# Patient Record
Sex: Male | Born: 1970 | Race: White | Hispanic: No | Marital: Married | Smoking: Former smoker
Health system: Southern US, Community
[De-identification: ages and names within clinical notes are randomized; demographics above are authoritative.]

## PROBLEM LIST (undated history)

## (undated) DIAGNOSIS — E785 Hyperlipidemia, unspecified: Secondary | ICD-10-CM

## (undated) DIAGNOSIS — K219 Gastro-esophageal reflux disease without esophagitis: Secondary | ICD-10-CM

## (undated) DIAGNOSIS — B019 Varicella without complication: Secondary | ICD-10-CM

## (undated) DIAGNOSIS — M1990 Unspecified osteoarthritis, unspecified site: Secondary | ICD-10-CM

## (undated) HISTORY — DX: Varicella without complication: B01.9

## (undated) HISTORY — DX: Gastro-esophageal reflux disease without esophagitis: K21.9

## (undated) HISTORY — DX: Unspecified osteoarthritis, unspecified site: M19.90

## (undated) HISTORY — DX: Hyperlipidemia, unspecified: E78.5

---
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
## 2004-06-13 ENCOUNTER — Ambulatory Visit: Payer: Self-pay | Admitting: Pulmonary Disease

## 2009-05-14 ENCOUNTER — Emergency Department (HOSPITAL_COMMUNITY): Admission: EM | Admit: 2009-05-14 | Discharge: 2009-05-14 | Payer: Self-pay | Admitting: Emergency Medicine

## 2009-05-27 ENCOUNTER — Emergency Department (HOSPITAL_COMMUNITY): Admission: EM | Admit: 2009-05-27 | Discharge: 2009-05-27 | Payer: Self-pay | Admitting: Emergency Medicine

## 2010-07-21 LAB — POCT I-STAT, CHEM 8
Calcium, Ion: 1.26 mmol/L (ref 1.12–1.32)
Chloride: 107 mEq/L (ref 96–112)
Glucose, Bld: 92 mg/dL (ref 70–99)
HCT: 42 % (ref 39.0–52.0)
Hemoglobin: 14.3 g/dL (ref 13.0–17.0)
Potassium: 4 mEq/L (ref 3.5–5.1)
Sodium: 139 mEq/L (ref 135–145)
TCO2: 28 mmol/L (ref 0–100)

## 2010-07-21 LAB — CBC
Hemoglobin: 14.1 g/dL (ref 13.0–17.0)
Platelets: 297 10*3/uL (ref 150–400)
RDW: 13.5 % (ref 11.5–15.5)

## 2010-07-21 LAB — DIFFERENTIAL
Basophils Absolute: 0.1 10*3/uL (ref 0.0–0.1)
Basophils Relative: 1 % (ref 0–1)
Lymphocytes Relative: 21 % (ref 12–46)
Monocytes Relative: 6 % (ref 3–12)
Neutro Abs: 5.7 10*3/uL (ref 1.7–7.7)

## 2010-07-21 LAB — POCT CARDIAC MARKERS: Myoglobin, poc: 73.4 ng/mL (ref 12–200)

## 2012-03-30 ENCOUNTER — Ambulatory Visit: Payer: Self-pay | Admitting: Internal Medicine

## 2012-03-30 DIAGNOSIS — Z0289 Encounter for other administrative examinations: Secondary | ICD-10-CM

## 2012-07-29 ENCOUNTER — Encounter: Payer: Self-pay | Admitting: Internal Medicine

## 2012-07-29 ENCOUNTER — Ambulatory Visit (INDEPENDENT_AMBULATORY_CARE_PROVIDER_SITE_OTHER)
Admission: RE | Admit: 2012-07-29 | Discharge: 2012-07-29 | Disposition: A | Discharge status: Home / Self Care | Payer: Self-pay | Source: Ambulatory Visit | Attending: Internal Medicine | Admitting: Internal Medicine

## 2012-07-29 ENCOUNTER — Ambulatory Visit (INDEPENDENT_AMBULATORY_CARE_PROVIDER_SITE_OTHER): Payer: Self-pay | Admitting: Internal Medicine

## 2012-07-29 VITALS — BP 110/80 | HR 69 | Temp 98.2°F | Ht 67.25 in | Wt 178.0 lb

## 2012-07-29 DIAGNOSIS — F17200 Nicotine dependence, unspecified, uncomplicated: Secondary | ICD-10-CM

## 2012-07-29 DIAGNOSIS — Z720 Tobacco use: Secondary | ICD-10-CM

## 2012-07-29 DIAGNOSIS — R05 Cough: Secondary | ICD-10-CM | POA: Insufficient documentation

## 2012-07-29 DIAGNOSIS — J302 Other seasonal allergic rhinitis: Secondary | ICD-10-CM | POA: Insufficient documentation

## 2012-07-29 DIAGNOSIS — J309 Allergic rhinitis, unspecified: Secondary | ICD-10-CM

## 2012-07-29 MED ORDER — HYDROCODONE-HOMATROPINE 5-1.5 MG/5ML PO SYRP: 5.0000 mL | ORAL_SOLUTION | ORAL | Status: DC | PRN

## 2012-07-29 MED ORDER — PREDNISONE 20 MG PO TABS: ORAL_TABLET | ORAL | Status: DC

## 2012-07-29 NOTE — Patient Instructions (Addendum)
This could be a reaction tot he inhalation Get a chest x ray.    If ok.    plan steroid tx  And stop smoking. As we discussed .  Also consider taking allergy medicine.   Such as  Allegra claritin and  Zyrtec.  Will look into the  Poss of arsenic exposure testing  And let you know about this.      Cough, Adult  A cough is a reflex that helps clear your throat and airways. It can help heal the body or may be a reaction to an irritated airway. A cough may only last 2 or 3 weeks (acute) or may last more than 8 weeks (chronic).  CAUSES Acute cough:  Viral or bacterial infections. Chronic cough:  Infections.  Allergies.  Asthma.  Post-nasal drip.  Smoking.  Heartburn or acid reflux.  Some medicines.  Chronic lung problems (COPD).  Cancer. SYMPTOMS   Cough.  Fever.  Chest pain.  Increased breathing rate.  High-pitched whistling sound when breathing (wheezing).  Colored mucus that you cough up (sputum). TREATMENT   A bacterial cough may be treated with antibiotic medicine.  A viral cough must run its course and will not respond to antibiotics.  Your caregiver may recommend other treatments if you have a chronic cough. HOME CARE INSTRUCTIONS   Only take over-the-counter or prescription medicines for pain, discomfort, or fever as directed by your caregiver. Use cough suppressants only as directed by your caregiver.  Use a cold steam vaporizer or humidifier in your bedroom or home to help loosen secretions.  Sleep in a semi-upright position if your cough is worse at night.  Rest as needed.  Stop smoking if you smoke. SEEK IMMEDIATE MEDICAL CARE IF:   You have pus in your sputum.  Your cough starts to worsen.  You cannot control your cough with suppressants and are losing sleep.  You begin coughing up blood.  You have difficulty breathing.  You develop pain which is getting worse or is uncontrolled with medicine.  You have a fever. MAKE SURE YOU:    Understand these instructions.  Will watch your condition.  Will get help right away if you are not doing well or get worse. Document Released: 10/17/2010 Document Revised: 07/13/2011 Document Reviewed: 10/17/2010 [facility] Patient Information 2013 [place], [place].

## 2012-07-29 NOTE — Progress Notes (Signed)
Chief Complaint  Patient presents with  . Establish Care    Seen Dr. Tawanna Cooler 10 years ago and has had no other PCP since then.  Complains of an ongoing cough lasting for several weeks.    HPI: Patient comes in as new patient visit . Previous care was  As above .  He has generally been well up until this problem.  orig from  [place].   In [place] but been in [place] since 1986. His mom is a patient in our practice. He's had a problem with coughing and some chest congestion for about a month almost immediately after he was cleaning out chicken coop area and there was some dust. He had upper nasal congestion and coughing but no shortness of breath fever hemoptysis. No fever. Body aches Some of the symptoms and cough was  Worse last week.    Tobacco  Quit for 2 years after  20 year   And then started back 4 months ago.  Stress at work  Wife still smokes .   Smoking 1/2ppd. .   Planning to stop. Has been trying a number of over-the-counter medications ;mucinex  vicks night time   History of allergies  In spring  .   Itchy. eyes   No hx of asthma. Aches over-the-counter Pets dog  And chickens.  After  out coop .   Works as a Financial trader   3 months ago there was some contaminated water they had to clean up unbeknownst to them via right RF might oh Shop vac .   the   after busted water line   there was some concern that there was arsenic iinthe  Water.    Supervisor said that by her mitral wipes and samples were done but no results were given Took asian iodine .  On his own in case there was arsenic exposure since that time he denies any neurologic symptoms significant nausea headaches just the respiratory problems.  ROS: See pertinent positives and negatives per HPI. Negative for chest pain shortness of breath vision hearing changes change in bowel habits major orthopedic problems or neurologic problems. Rest of ROS negative or noncontributory he has had atypical moles removed him from  his back seen a dermatologist in the last year but wants to change for .  Moms in 4  Healthy .  2 sibs younger brothers ok.   Past Medical History  Diagnosis Date  . GERD (gastroesophageal reflux disease)   . Chicken pox   . Hyperlipidemia     Family History  Problem Relation Age of Onset  . Arthritis Mother   . Hyperlipidemia Mother   . Hyperlipidemia Father   . Cancer Maternal Grandmother     Colon Cancer    History   Social History  . Marital Status: Single    Spouse Name: N/A    Number of Children: N/A  . Years of Education: N/A   Social History Main Topics  . Smoking status: Current Every Day Smoker  . Smokeless tobacco: None  . Alcohol Use: Yes  . Drug Use: None  . Sexually Active: None   Other Topics Concern  . None   Social History Narrative   Sleeps 7 hours per night   Married employed Haematologist degree [employer]      Originally from [place] in [place] since 175   83-year-old child at home wife smokes      etoh weekend    Tobacco  1/2pp d for months again from stress at work   Prev 20 pack year   Smoke alarm neg FA    Last td 2013           Outpatient Encounter Prescriptions as of 07/29/2012  Medication Sig Dispense Refill  . HYDROcodone-homatropine (HYCODAN) 5-1.5 MG/5ML syrup Take 5 mLs by mouth every 4 (four) hours as needed for cough.  180 mL  0  . predniSONE (DELTASONE) 20 MG tablet Take 3 po qd for 2 days then 2 po qd for 3 days,or as directed  12 tablet  0   No facility-administered encounter medications on file as of 07/29/2012.    EXAM:  BP 110/80  Pulse 69  Temp(Src) 98.2 F (36.8 C) (Oral)  Ht 5' 7.25" (1.708 m)  Wt 178 lb (80.74 kg)  BMI 27.68 kg/m2  SpO2 97%  Body mass index is 27.68 kg/(m^2).  GENERAL: vitals reviewed and listed above, alert, oriented, appears well hydrated and in no acute distress HEENT: atraumatic, conjunctiva  clear, no obvious abnormalities on inspection of external nose  and ears TMs intact nares slight congestion face nontender OP : no lesion edema or exudate   NECK: no obvious masses on inspection palpation no adenopathy or JVD LUNGS: clear to auscultation bilaterally, no wheezes, rales or rhonchi, good air movement  CV: HRRR, no gallops or murmurs no clubbing cyanosis or  peripheral edema nl cap refill  Abdomen soft without organomegaly guarding or rebound MS: moves all extremities without noticeable focal  abnormality Skin no acute changes there are multiple moles on the back some removed residual pigmentation PSYCH: pleasant and cooperative, no obvious depression or anxiety Neurologic is grossly normal normal gait is healthy otherwise ASSESSMENT AND PLAN:  Discussed the following assessment and plan:  Cough, persistent - Plan: DG Chest 2 View  Tobacco user - Plan: DG Chest 2 View  Allergic rhinitis, seasonal Onset after cleaning the chicken coupe and probably has allergic underneath phenomenon he is a smoker but had quit for a couple years. No obvious secondary infection at this time we'll consider using appear to steroids if chest x-ray is negative counseled on tobacco cessation avoiding environmental exposures.  Would have him and antihistamine in the meantime as this is the spring season airway protection when he works.   In regard to the question about arsenic exposure not from tobacco doubt if there is a good way to test at this point unless his hair and nail samples however we'll look into this certainly if he has symptoms.   Of concern further testing can be done.   disc about getting a  preventive visit with lab work when possible or if his cough isn't getting better more evaluation  Counseled. -Patient advised to return or notify health care team  if symptoms worsen or persist or new concerns arise.  Patient Instructions  This could be a reaction tot he inhalation Get a chest x ray.    If ok.    plan steroid tx  And stop smoking. As  we discussed .  Also consider taking allergy medicine.   Such as  Allegra claritin and  Zyrtec.  Will look into the  Poss of arsenic exposure testing  And let you know about this.      Cough, Adult  A cough is a reflex that helps clear your throat and airways. It can help heal the body or may be a reaction to an irritated airway. A cough may only last  2 or 3 weeks (acute) or may last more than 8 weeks (chronic).  CAUSES Acute cough:  Viral or bacterial infections. Chronic cough:  Infections.  Allergies.  Asthma.  Post-nasal drip.  Smoking.  Heartburn or acid reflux.  Some medicines.  Chronic lung problems (COPD).  Cancer. SYMPTOMS   Cough.  Fever.  Chest pain.  Increased breathing rate.  High-pitched whistling sound when breathing (wheezing).  Colored mucus that you cough up (sputum). TREATMENT   A bacterial cough may be treated with antibiotic medicine.  A viral cough must run its course and will not respond to antibiotics.  Your caregiver may recommend other treatments if you have a chronic cough. HOME CARE INSTRUCTIONS   Only take over-the-counter or prescription medicines for pain, discomfort, or fever as directed by your caregiver. Use cough suppressants only as directed by your caregiver.  Use a cold steam vaporizer or humidifier in your bedroom or home to help loosen secretions.  Sleep in a semi-upright position if your cough is worse at night.  Rest as needed.  Stop smoking if you smoke. SEEK IMMEDIATE MEDICAL CARE IF:   You have pus in your sputum.  Your cough starts to worsen.  You cannot control your cough with suppressants and are losing sleep.  You begin coughing up blood.  You have difficulty breathing.  You develop pain which is getting worse or is uncontrolled with medicine.  You have a fever. MAKE SURE YOU:   Understand these instructions.  Will watch your condition.  Will get help right away if you are not doing well  or get worse. Document Released: 10/17/2010 Document Revised: 07/13/2011 Document Reviewed: 10/17/2010 [facility] Patient Information 2013 [place], [place].    Neta Mends. Adream Parzych M.D.

## 2012-08-24 ENCOUNTER — Other Ambulatory Visit (INDEPENDENT_AMBULATORY_CARE_PROVIDER_SITE_OTHER): Payer: BC Managed Care – PPO

## 2012-08-24 DIAGNOSIS — Z0000 Encounter for general adult medical examination without abnormal findings: Secondary | ICD-10-CM

## 2012-08-24 LAB — BASIC METABOLIC PANEL
CO2: 27 mEq/L (ref 19–32)
Calcium: 8.8 mg/dL (ref 8.4–10.5)
Creatinine, Ser: 1.3 mg/dL (ref 0.4–1.5)
GFR: 65.19 mL/min (ref >60.00)
Sodium: 137 mEq/L (ref 135–145)

## 2012-08-24 LAB — CBC WITH DIFFERENTIAL/PLATELET
Eosinophils Relative: 6.8 % — ABNORMAL HIGH (ref 0.0–5.0)
HCT: 40.5 % (ref 39.0–52.0)
Hemoglobin: 14 g/dL (ref 13.0–17.0)
Lymphs Abs: 1.3 10*3/uL (ref 0.7–4.0)
Monocytes Relative: 7 % (ref 3.0–12.0)
Neutro Abs: 3.4 10*3/uL (ref 1.4–7.7)
WBC: 5.5 10*3/uL (ref 4.5–10.5)

## 2012-08-24 LAB — HEPATIC FUNCTION PANEL
ALT: 36 U/L (ref 0–53)
AST: 25 U/L (ref 0–37)
Total Protein: 6.6 g/dL (ref 6.0–8.3)

## 2012-08-24 LAB — LIPID PANEL
Cholesterol: 238 mg/dL — ABNORMAL HIGH (ref 0–200)
HDL: 51.7 mg/dL (ref >39.00)
VLDL: 31.8 mg/dL (ref 0.0–40.0)

## 2012-08-26 ENCOUNTER — Ambulatory Visit: Payer: BC Managed Care – PPO

## 2012-08-26 DIAGNOSIS — R7309 Other abnormal glucose: Secondary | ICD-10-CM

## 2012-08-26 NOTE — Progress Notes (Signed)
Left a message for pt to return call 

## 2012-08-31 ENCOUNTER — Ambulatory Visit (INDEPENDENT_AMBULATORY_CARE_PROVIDER_SITE_OTHER): Payer: BC Managed Care – PPO | Admitting: Internal Medicine

## 2012-08-31 ENCOUNTER — Encounter: Payer: Self-pay | Admitting: Internal Medicine

## 2012-08-31 VITALS — BP 100/74 | HR 79 | Temp 98.2°F | Ht 67.25 in | Wt 179.0 lb

## 2012-08-31 DIAGNOSIS — D239 Other benign neoplasm of skin, unspecified: Secondary | ICD-10-CM

## 2012-08-31 DIAGNOSIS — Q859 Phakomatosis, unspecified: Secondary | ICD-10-CM

## 2012-08-31 DIAGNOSIS — Z0000 Encounter for general adult medical examination without abnormal findings: Secondary | ICD-10-CM

## 2012-08-31 DIAGNOSIS — E785 Hyperlipidemia, unspecified: Secondary | ICD-10-CM

## 2012-08-31 DIAGNOSIS — R7309 Other abnormal glucose: Secondary | ICD-10-CM

## 2012-08-31 DIAGNOSIS — F17200 Nicotine dependence, unspecified, uncomplicated: Secondary | ICD-10-CM

## 2012-08-31 DIAGNOSIS — Z720 Tobacco use: Secondary | ICD-10-CM

## 2012-08-31 DIAGNOSIS — R739 Hyperglycemia, unspecified: Secondary | ICD-10-CM | POA: Insufficient documentation

## 2012-08-31 NOTE — Progress Notes (Signed)
Chief Complaint  Patient presents with  . Annual Exam    HPI: Patient comes in today for Preventive Health Care visit  Since his last visit his cough was subsided after the prednisone his chest x-ray was normal it was felt to be from a reaction from chicken dust. He is still smoking some but not a lot has smoked in the past and wants to stop again has to psych consult up. We'll use the patches he states. Otherwise no major changes in his health  He ate   a pop tart before the lab tests this time hasn't eating as healthy there is diabetes in the family but generally feels well.  Etoh. Sporadic but can go a 6-12 pack a week and not every day Tobacco since a child.   Has usedPatches   in the past . To stop wife smokes as a child at home wants to stop  Has some difficulty with sleep at times no sleep apnea. ROS:  GEN/ HEENT: No fever, significant weight changes sweats headaches vision problems hearing changes, CV/ PULM; No chest pain shortness of breath cough, syncope,edema  change in exercise tolerance. GI /GU: No adominal pain, vomiting, change in bowel habits. No blood in the stool. No significant GU symptoms. SKIN/HEME: ,no acute skin rashes suspicious lesions or bleeding. No lymphadenopathy, nodules, masses. He has multiple moles and some dysplastic is supposed to see a dermatologist every year. Has had some removed NEURO/ PSYCH:  No neurologic signs such as weakness numbness. No depression anxiety. IMM/ Allergy: No unusual infections.  Allergy .   REST of 12 system review negative except as per HPI   Past Medical History  Diagnosis Date  . GERD (gastroesophageal reflux disease)   . Chicken pox   . Hyperlipidemia     Family History  Problem Relation Age of Onset  . Arthritis Mother   . Hyperlipidemia Mother   . Hyperlipidemia Father   . Cancer Maternal Grandmother     Colon Cancer    History   Social History  . Marital Status: Single    Spouse Name: N/A    Number of  Children: N/A  . Years of Education: N/A   Social History Main Topics  . Smoking status: Current Every Day Smoker  . Smokeless tobacco: None  . Alcohol Use: Yes  . Drug Use: None  . Sexually Active: None   Other Topics Concern  . 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 [DISCONTINUED] HYDROcodone-homatropine (HYCODAN) 5-1.5 MG/5ML syrup Take 5 mLs by mouth every 4 (four) hours as needed for cough.  180 mL  0  . [DISCONTINUED] predniSONE (DELTASONE) 20 MG tablet Take 3 po qd for 2 days then 2 po qd for 3 days,or as directed  12 tablet  0   No facility-administered encounter medications on file as of 08/31/2012.    EXAM:  BP 100/74  Pulse 79  Temp(Src) 98.2 F (36.8 C) (Oral)  Ht 5' 7.25" (1.708 m)  Wt 179 lb (81.194 kg)  BMI 27.83 kg/m2  SpO2 98%  Body mass index is 27.83 kg/(m^2).  Physical Exam: Vital signs reviewed YNW:GNFA is a well-developed well-nourished alert cooperative   male who appears her stated age in no acute distress.  HEENT: normocephalic atraumatic , Eyes: PERRL EOM's full, conjunctiva clear, Nares: paten,t no deformity discharge or tenderness., Ears: no deformity EAC's clear TMs with normal landmarks. Mouth: clear OP, no lesions, edema.  Moist mucous membranes. Dentition in adequate repair. NECK: supple without masses, thyromegaly or bruits. CHEST/PULM:  Clear to auscultation and percussion  breath sounds equal no wheeze , rales or rhonchi. No chest wall deformities or tenderness. CV: PMI is nondisplaced, S1 S2 no gallops, murmurs, rubs. Peripheral pulses are full without delay.No JVD .  ABDOMEN: Bowel sounds normal nontender  No guard or rebound, no hepato splenomegal no CVA tenderness.  No hernia. Extremtities:  No clubbing cyanosis or edema, no acute joint swelling or redness no focal atrophy NEURO:  Oriented x3, cranial nerves 3-12 appear to be intact, no obvious focal weakness,gait within normal limits no abnormal reflexes or asymmetrical SKIN: No acute rashes normal turgor, color, no bruising or petechiae. Multiple moles on the back scars from removal a few large and dark mostly 4-5 mm PSYCH: Oriented, good eye contact, no obvious depression anxiety, cognition and judgment appear normal. LN: no cervical axillary inguinal adenopathy  Lab Results  Component Value Date   WBC 5.5 08/24/2012   HGB 14.0 08/24/2012   HCT 40.5 08/24/2012   PLT 248.0 08/24/2012   GLUCOSE 146* 08/24/2012   CHOL 238* 08/24/2012   TRIG 159.0* 08/24/2012   HDL 51.70 08/24/2012   LDLDIRECT 168.3 08/24/2012   ALT 36 08/24/2012   AST 25 08/24/2012   NA 137 08/24/2012   K 3.5 08/24/2012   CL 103 08/24/2012   CREATININE 1.3 08/24/2012   BUN 23 08/24/2012   CO2 27 08/24/2012   TSH 0.87 08/24/2012   HGBA1C 6.0 08/26/2012    ASSESSMENT AND PLAN:  Discussed the following assessment and plan:  Visit for preventive health examination - Up to date discussed Pneumovax will decline this for now.  Other and unspecified hyperlipidemia - Triglycerides up non-fasting  Hyperglycemia - non fasting   a1c is 6.  Tobacco user - Counseled stopping smoking is the biggest thing he can do for his health at this time  Atypical mole syndrome - ? in family under dern surv  Counseled regarding healthy nutrition, exercise, sleep, injury prevention, calcium vit d and healthy weight . Get a flu shot in the fall  Patient Care  Team: Madelin Headings, MD as PCP - General (Internal Medicine) Patient Instructions  Stop tobacco  Limit alcohol   Limit simple sugars  processed  And  Animal fats  To prevent diabetes    And  vascular disease.  Mediterranean diet  Is the healthiest.   You sugar was borderline  Elevated even though not  Fasting  Sleep hygiene  Get  Your moles checked yearly      Preventive Care for Adults, Male A healthy lifestyle and preventive care can promote health and wellness. Preventive health guidelines for men include the following key practices:  A routine yearly physical is a good way to  check with your caregiver about your health and preventative screening. It is a chance to share any concerns and updates on your health, and to receive a thorough exam.  Visit your dentist for a routine exam and preventative care every 6 months. Brush your teeth twice a day and floss once a day. Good oral hygiene prevents tooth decay and gum disease.  The frequency of eye exams is based on your age, health, family medical history, use of contact lenses, and other factors. Follow your caregiver's recommendations for frequency of eye exams.  Eat a healthy diet. Foods like vegetables, fruits, whole grains, low-fat dairy products, and lean protein foods contain the nutrients you need without too many calories. Decrease your intake of foods high in solid fats, added sugars, and salt. Eat the right amount of calories for you.Get information about a proper diet from your caregiver, if necessary.  Regular physical exercise is one of the most important things you can do for your health. Most adults should get at least 150 minutes of moderate-intensity exercise (any activity that increases your heart rate and causes you to sweat) each week. In addition, most adults need muscle-strengthening exercises on 2 or more days a week.  Maintain a healthy weight. The body mass index (BMI) is a screening tool to identify possible  weight problems. It provides an estimate of body fat based on height and weight. Your caregiver can help determine your BMI, and can help you achieve or maintain a healthy weight.For adults 20 years and older:  A BMI below 18.5 is considered underweight.  A BMI of 18.5 to 24.9 is normal.  A BMI of 25 to 29.9 is considered overweight.  A BMI of 30 and above is considered obese.  Maintain normal blood lipids and cholesterol levels by exercising and minimizing your intake of saturated fat. Eat a balanced diet with plenty of fruit and vegetables. Blood tests for lipids and cholesterol should begin at age 7 and be repeated every 5 years. If your lipid or cholesterol levels are high, you are over 50, or you are a high risk for heart disease, you may need your cholesterol levels checked more frequently.Ongoing high lipid and cholesterol levels should be treated with medicines if diet and exercise are not effective.  If you smoke, find out from your caregiver how to quit. If you do not use tobacco, do not start.  If you choose to drink alcohol, do not exceed 2 drinks per day. One drink is considered to be 12 ounces (355 mL) of beer, 5 ounces (148 mL) of wine, or 1.5 ounces (44 mL) of liquor.  Avoid use of street drugs. Do not share needles with anyone. Ask for help if you need support or instructions about stopping the use of drugs.  High blood pressure causes heart disease and increases the risk of stroke. Your blood pressure should be checked at least every 1 to 2 years. Ongoing high blood pressure should be treated with medicines, if weight loss and exercise are not effective.  If you are 32 to 43 years old, ask your caregiver if you should take aspirin to prevent heart disease.  Diabetes screening involves taking a blood sample to check your fasting blood sugar level. This should be done once every 3 years, after age 29, if you are within normal weight and without risk factors for diabetes.  Testing should be considered at a younger age or be carried out more frequently if you are overweight and have  at least 1 risk factor for diabetes.  Colorectal cancer can be detected and often prevented. Most routine colorectal cancer screening begins at the age of 59 and continues through age 37. However, your caregiver may recommend screening at an earlier age if you have risk factors for colon cancer. On a yearly basis, your caregiver may provide home test kits to check for hidden blood in the stool. Use of a small camera at the end of a tube, to directly examine the colon (sigmoidoscopy or colonoscopy), can detect the earliest forms of colorectal cancer. Talk to your caregiver about this at age 73, when routine screening begins. Direct examination of the colon should be repeated every 5 to 10 years through age 62, unless early forms of pre-cancerous polyps or small growths are found.  Hepatitis C blood testing is recommended for all people born from [birth year] through 1965 and any individual with known risks for hepatitis C.  Practice safe sex. Use condoms and avoid high-risk sexual practices to reduce the spread of sexually transmitted infections (STIs). STIs include gonorrhea, chlamydia, syphilis, trichomonas, herpes, HPV, and human immunodeficiency virus (HIV). Herpes, HIV, and HPV are viral illnesses that have no cure. They can result in disability, cancer, and death.  A one-time screening for abdominal aortic aneurysm (AAA) and surgical repair of large AAAs by sound wave imaging (ultrasonography) is recommended for ages 82 to 106 years who are current or former smokers.  Healthy men should no longer receive prostate-specific antigen (PSA) blood tests as part of routine cancer screening. Consult with your caregiver about prostate cancer screening.  Testicular cancer screening is not recommended for adult males who have no symptoms. Screening includes self-exam, caregiver exam, and other screening  tests. Consult with your caregiver about any symptoms you have or any concerns you have about testicular cancer.  Use sunscreen with skin protection factor (SPF) of 30 or more. Apply sunscreen liberally and repeatedly throughout the day. You should seek shade when your shadow is shorter than you. Protect yourself by wearing long sleeves, pants, a wide-brimmed hat, and sunglasses year round, whenever you are outdoors.  Once a month, do a whole body skin exam, using a mirror to look at the skin on your back. Notify your caregiver of new moles, moles that have irregular borders, moles that are larger than a pencil eraser, or moles that have changed in shape or color.  Stay current with required immunizations.  Influenza. You need a dose every fall (or winter). The composition of the flu vaccine changes each year, so being vaccinated once is not enough.  Pneumococcal polysaccharide. You need 1 to 2 doses if you smoke cigarettes or if you have certain chronic medical conditions. You need 1 dose at age 39 (or older) if you have never been vaccinated.  Tetanus, diphtheria, pertussis (Tdap, Td). Get 1 dose of Tdap vaccine if you are younger than age 68 years, are over 64 and have contact with an infant, are a Research scientist (physical sciences), or simply want to be protected from whooping cough. After that, you need a Td booster dose every 10 years. Consult your caregiver if you have not had at least 3 tetanus and diphtheria-containing shots sometime in your life or have a deep or dirty wound.  HPV. This vaccine is recommended for males 13 through 43 years of age. This vaccine may be given to men 22 through 43 years of age who have not completed the 3 dose series. It is recommended for men through age  26 who have sex with men or whose immune system is weakened because of HIV infection, other illness, or medications. The vaccine is given in 3 doses over 6 months.  Measles, mumps, rubella (MMR). You need at least 1 dose of MMR  if you were born in 1957 or later. You may also need a 2nd dose.  Meningococcal. If you are age 41 to 100 years and a Orthoptist living in a residence hall, or have one of several medical conditions, you need to get vaccinated against meningococcal disease. You may also need additional booster doses.  Zoster (shingles). If you are age 37 years or older, you should get this vaccine.  Varicella (chickenpox). If you have never had chickenpox or you were vaccinated but received only 1 dose, talk to your caregiver to find out if you need this vaccine.  Hepatitis A. You need this vaccine if you have a specific risk factor for hepatitis A virus infection, or you simply wish to be protected from this disease. The vaccine is usually given as 2 doses, 6 to 18 months apart.  Hepatitis B. You need this vaccine if you have a specific risk factor for hepatitis B virus infection or you simply wish to be protected from this disease. The vaccine is given in 3 doses, usually over 6 months. Preventative Service / Frequency Ages 47 to 57  Blood pressure check.** / Every 1 to 2 years.  Lipid and cholesterol check.** / Every 5 years beginning at age 38.  Hepatitis C blood test.** / For any individual with known risks for hepatitis C.  Skin self-exam. / Monthly.  Influenza immunization.** / Every year.  Pneumococcal polysaccharide immunization.** / 1 to 2 doses if you smoke cigarettes or if you have certain chronic medical conditions.  Tetanus, diphtheria, pertussis (Tdap,Td) immunization. / A one-time dose of Tdap vaccine. After that, you need a Td booster dose every 10 years.  HPV immunization. / 3 doses over 6 months, if 26 and younger.  Measles, mumps, rubella (MMR) immunization. / You need at least 1 dose of MMR if you were born in 1957 or later. You may also need a 2nd dose.  Meningococcal immunization. / 1 dose if you are age 54 to 31 years and a Orthoptist living in a  residence hall, or have one of several medical conditions, you need to get vaccinated against meningococcal disease. You may also need additional booster doses.  Varicella immunization.** / Consult your caregiver.  Hepatitis A immunization.** / Consult your caregiver. 2 doses, 6 to 18 months apart.  Hepatitis B immunization.** / Consult your caregiver. 3 doses usually over 6 months. Ages 63 to 76  Blood pressure check.** / Every 1 to 2 years.  Lipid and cholesterol check.** / Every 5 years beginning at age 53.  Fecal occult blood test (FOBT) of stool. / Every year beginning at age 21 and continuing until age 21. You may not have to do this test if you get colonoscopy every 10 years.  Flexible sigmoidoscopy** or colonoscopy.** / Every 5 years for a flexible sigmoidoscopy or every 10 years for a colonoscopy beginning at age 38 and continuing until age 31.  Hepatitis C blood test.** / For all people born from [birth year] through 1965 and any individual with known risks for hepatitis C.  Skin self-exam. / Monthly.  Influenza immunization.** / Every year.  Pneumococcal polysaccharide immunization.** / 1 to 2 doses if you smoke cigarettes or if you have certain  chronic medical conditions.  Tetanus, diphtheria, pertussis (Tdap/Td) immunization.** / A one-time dose of Tdap vaccine. After that, you need a Td booster dose every 10 years.  Measles, mumps, rubella (MMR) immunization. / You need at least 1 dose of MMR if you were born in 1957 or later. You may also need a 2nd dose.  Varicella immunization.**/ Consult your caregiver.  Meningococcal immunization.** / Consult your caregiver.  Hepatitis A immunization.** / Consult your caregiver. 2 doses, 6 to 18 months apart.  Hepatitis B immunization.** / Consult your caregiver. 3 doses, usually over 6 months. Ages 49 and over  Blood pressure check.** / Every 1 to 2 years.  Lipid and cholesterol check.**/ Every 5 years beginning at age  10.  Fecal occult blood test (FOBT) of stool. / Every year beginning at age 15 and continuing until age 71. You may not have to do this test if you get colonoscopy every 10 years.  Flexible sigmoidoscopy** or colonoscopy.** / Every 5 years for a flexible sigmoidoscopy or every 10 years for a colonoscopy beginning at age 3 and continuing until age 24.  Hepatitis C blood test.** / For all people born from [birth year] through 1965 and any individual with known risks for hepatitis C.  Abdominal aortic aneurysm (AAA) screening.** / A one-time screening for ages 34 to 103 years who are current or former smokers.  Skin self-exam. / Monthly.  Influenza immunization.** / Every year.  Pneumococcal polysaccharide immunization.** / 1 dose at age 77 (or older) if you have never been vaccinated.  Tetanus, diphtheria, pertussis (Tdap, Td) immunization. / A one-time dose of Tdap vaccine if you are over 65 and have contact with an infant, are a Research scientist (physical sciences), or simply want to be protected from whooping cough. After that, you need a Td booster dose every 10 years.  Varicella immunization. ** / Consult your caregiver.  Meningococcal immunization.** / Consult your caregiver.  Hepatitis A immunization. ** / Consult your caregiver. 2 doses, 6 to 18 months apart.  Hepatitis B immunization.** / Check with your caregiver. 3 doses, usually over 6 months. **Family history and personal history of risk and conditions may change your caregiver's recommendations. Document Released: 06/16/2001 Document Revised: 07/13/2011 Document Reviewed: 09/15/2010 [facility] Patient Information 2013 [place], [place].     Insomnia Insomnia is frequent trouble falling and/or staying asleep. Insomnia can be a long term problem or a short term problem. Both are common. Insomnia can be a short term problem when the wakefulness is related to a certain stress or worry. Long term insomnia is often related to ongoing stress during waking  hours and/or poor sleeping habits. Overtime, sleep deprivation itself can make the problem worse. Every little thing feels more severe because you are overtired and your ability to cope is decreased. CAUSES   Stress, anxiety, and depression.  Poor sleeping habits.  Distractions such as TV in the bedroom.  Naps close to bedtime.  Engaging in emotionally charged conversations before bed.  Technical reading before sleep.  Alcohol and other sedatives. They may make the problem worse. They can hurt normal sleep patterns and normal dream activity.  Stimulants such as caffeine for several hours prior to bedtime.  Pain syndromes and shortness of breath can cause insomnia.  Exercise late at night.  Changing time zones may cause sleeping problems (jet lag). It is sometimes helpful to have someone observe your sleeping patterns. They should look for periods of not breathing during the night (sleep apnea). They should also look to  see how long those periods last. If you live alone or observers are uncertain, you can also be observed at a sleep clinic where your sleep patterns will be professionally monitored. Sleep apnea requires a checkup and treatment. Give your caregivers your medical history. Give your caregivers observations your family has made about your sleep.  SYMPTOMS   Not feeling rested in the morning.  Anxiety and restlessness at bedtime.  Difficulty falling and staying asleep. TREATMENT   Your caregiver may prescribe treatment for an underlying medical disorders. Your caregiver can give advice or help if you are using alcohol or other drugs for self-medication. Treatment of underlying problems will usually eliminate insomnia problems.  Medications can be prescribed for short time use. They are generally not recommended for lengthy use.  Over-the-counter sleep medicines are not recommended for lengthy use. They can be habit forming.  You can promote easier sleeping by making  lifestyle changes such as:  Using relaxation techniques that help with breathing and reduce muscle tension.  Exercising earlier in the day.  Changing your diet and the time of your last meal. No night time snacks.  Establish a regular time to go to bed.  Counseling can help with stressful problems and worry.  Soothing music and white noise may be helpful if there are background noises you cannot remove.  Stop tedious detailed work at least one hour before bedtime. HOME CARE INSTRUCTIONS   Keep a diary. Inform your caregiver about your progress. This includes any medication side effects. See your caregiver regularly. Take note of:  Times when you are asleep.  Times when you are awake during the night.  The quality of your sleep.  How you feel the next day. This information will help your caregiver care for you.  Get out of bed if you are still awake after 15 minutes. Read or do some quiet activity. Keep the lights down. Wait until you feel sleepy and go back to bed.  Keep regular sleeping and waking hours. Avoid naps.  Exercise regularly.  Avoid distractions at bedtime. Distractions include watching television or engaging in any intense or detailed activity like attempting to balance the household checkbook.  Develop a bedtime ritual. Keep a familiar routine of bathing, brushing your teeth, climbing into bed at the same time each night, listening to soothing music. Routines increase the success of falling to sleep faster.  Use relaxation techniques. This can be using breathing and muscle tension release routines. It can also include visualizing peaceful scenes. You can also help control troubling or intruding thoughts by keeping your mind occupied with boring or repetitive thoughts like the old concept of counting sheep. You can make it more creative like imagining planting one beautiful flower after another in your backyard garden.  During your day, work to eliminate stress.  When this is not possible use some of the previous suggestions to help reduce the anxiety that accompanies stressful situations. MAKE SURE YOU:   Understand these instructions.  Will watch your condition.  Will get help right away if you are not doing well or get worse. Document Released: 04/17/2000 Document Revised: 07/13/2011 Document Reviewed: 05/18/2007 [facility] Patient Information 2013 [place], [place].      Neta Mends. Nyelli Samara M.D. Health Maintenance  Topic Date Due  . Influenza Vaccine  01/02/2013  . Tetanus/tdap  03/07/2022   Health Maintenance Review

## 2012-08-31 NOTE — Patient Instructions (Addendum)
Stop tobacco  Limit alcohol   Limit simple sugars  processed  And  Animal fats  To prevent diabetes    And  vascular disease.  Mediterranean diet  Is the healthiest.   You sugar was borderline  Elevated even though not  Fasting  Sleep hygiene  Get  Your moles checked yearly      Preventive Care for Adults, Male A healthy lifestyle and preventive care can promote health and wellness. Preventive health guidelines for men include the following key practices:  A routine yearly physical is a good way to check with your caregiver about your health and preventative screening. It is a chance to share any concerns and updates on your health, and to receive a thorough exam.  Visit your dentist for a routine exam and preventative care every 6 months. Brush your teeth twice a day and floss once a day. Good oral hygiene prevents tooth decay and gum disease.  The frequency of eye exams is based on your age, health, family medical history, use of contact lenses, and other factors. Follow your caregiver's recommendations for frequency of eye exams.  Eat a healthy diet. Foods like vegetables, fruits, whole grains, low-fat dairy products, and lean protein foods contain the nutrients you need without too many calories. Decrease your intake of foods high in solid fats, added sugars, and salt. Eat the right amount of calories for you.Get information about a proper diet from your caregiver, if necessary.  Regular physical exercise is one of the most important things you can do for your health. Most adults should get at least 150 minutes of moderate-intensity exercise (any activity that increases your heart rate and causes you to sweat) each week. In addition, most adults need muscle-strengthening exercises on 2 or more days a week.  Maintain a healthy weight. The body mass index (BMI) is a screening tool to identify possible weight problems. It provides an estimate of body fat based on height and weight. Your  caregiver can help determine your BMI, and can help you achieve or maintain a healthy weight.For adults 20 years and older:  A BMI below 18.5 is considered underweight.  A BMI of 18.5 to 24.9 is normal.  A BMI of 25 to 29.9 is considered overweight.  A BMI of 30 and above is considered obese.  Maintain normal blood lipids and cholesterol levels by exercising and minimizing your intake of saturated fat. Eat a balanced diet with plenty of fruit and vegetables. Blood tests for lipids and cholesterol should begin at age 59 and be repeated every 5 years. If your lipid or cholesterol levels are high, you are over 50, or you are a high risk for heart disease, you may need your cholesterol levels checked more frequently.Ongoing high lipid and cholesterol levels should be treated with medicines if diet and exercise are not effective.  If you smoke, find out from your caregiver how to quit. If you do not use tobacco, do not start.  If you choose to drink alcohol, do not exceed 2 drinks per day. One drink is considered to be 12 ounces (355 mL) of beer, 5 ounces (148 mL) of wine, or 1.5 ounces (44 mL) of liquor.  Avoid use of street drugs. Do not share needles with anyone. Ask for help if you need support or instructions about stopping the use of drugs.  High blood pressure causes heart disease and increases the risk of stroke. Your blood pressure should be checked at least every 1 to  2 years. Ongoing high blood pressure should be treated with medicines, if weight loss and exercise are not effective.  If you are 88 to 43 years old, ask your caregiver if you should take aspirin to prevent heart disease.  Diabetes screening involves taking a blood sample to check your fasting blood sugar level. This should be done once every 3 years, after age 70, if you are within normal weight and without risk factors for diabetes. Testing should be considered at a younger age or be carried out more frequently if you are  overweight and have at least 1 risk factor for diabetes.  Colorectal cancer can be detected and often prevented. Most routine colorectal cancer screening begins at the age of 82 and continues through age 89. However, your caregiver may recommend screening at an earlier age if you have risk factors for colon cancer. On a yearly basis, your caregiver may provide home test kits to check for hidden blood in the stool. Use of a small camera at the end of a tube, to directly examine the colon (sigmoidoscopy or colonoscopy), can detect the earliest forms of colorectal cancer. Talk to your caregiver about this at age 51, when routine screening begins. Direct examination of the colon should be repeated every 5 to 10 years through age 79, unless early forms of pre-cancerous polyps or small growths are found.  Hepatitis C blood testing is recommended for all people born from [birth year] through 1965 and any individual with known risks for hepatitis C.  Practice safe sex. Use condoms and avoid high-risk sexual practices to reduce the spread of sexually transmitted infections (STIs). STIs include gonorrhea, chlamydia, syphilis, trichomonas, herpes, HPV, and human immunodeficiency virus (HIV). Herpes, HIV, and HPV are viral illnesses that have no cure. They can result in disability, cancer, and death.  A one-time screening for abdominal aortic aneurysm (AAA) and surgical repair of large AAAs by sound wave imaging (ultrasonography) is recommended for ages 88 to 76 years who are current or former smokers.  Healthy men should no longer receive prostate-specific antigen (PSA) blood tests as part of routine cancer screening. Consult with your caregiver about prostate cancer screening.  Testicular cancer screening is not recommended for adult males who have no symptoms. Screening includes self-exam, caregiver exam, and other screening tests. Consult with your caregiver about any symptoms you have or any concerns you have about  testicular cancer.  Use sunscreen with skin protection factor (SPF) of 30 or more. Apply sunscreen liberally and repeatedly throughout the day. You should seek shade when your shadow is shorter than you. Protect yourself by wearing long sleeves, pants, a wide-brimmed hat, and sunglasses year round, whenever you are outdoors.  Once a month, do a whole body skin exam, using a mirror to look at the skin on your back. Notify your caregiver of new moles, moles that have irregular borders, moles that are larger than a pencil eraser, or moles that have changed in shape or color.  Stay current with required immunizations.  Influenza. You need a dose every fall (or winter). The composition of the flu vaccine changes each year, so being vaccinated once is not enough.  Pneumococcal polysaccharide. You need 1 to 2 doses if you smoke cigarettes or if you have certain chronic medical conditions. You need 1 dose at age 73 (or older) if you have never been vaccinated.  Tetanus, diphtheria, pertussis (Tdap, Td). Get 1 dose of Tdap vaccine if you are younger than age 52 years, are over  65 and have contact with an infant, are a Research scientist (physical sciences), or simply want to be protected from whooping cough. After that, you need a Td booster dose every 10 years. Consult your caregiver if you have not had at least 3 tetanus and diphtheria-containing shots sometime in your life or have a deep or dirty wound.  HPV. This vaccine is recommended for males 13 through 43 years of age. This vaccine may be given to men 22 through 43 years of age who have not completed the 3 dose series. It is recommended for men through age 16 who have sex with men or whose immune system is weakened because of HIV infection, other illness, or medications. The vaccine is given in 3 doses over 6 months.  Measles, mumps, rubella (MMR). You need at least 1 dose of MMR if you were born in 1957 or later. You may also need a 2nd dose.  Meningococcal. If you are  age 88 to 62 years and a Orthoptist living in a residence hall, or have one of several medical conditions, you need to get vaccinated against meningococcal disease. You may also need additional booster doses.  Zoster (shingles). If you are age 52 years or older, you should get this vaccine.  Varicella (chickenpox). If you have never had chickenpox or you were vaccinated but received only 1 dose, talk to your caregiver to find out if you need this vaccine.  Hepatitis A. You need this vaccine if you have a specific risk factor for hepatitis A virus infection, or you simply wish to be protected from this disease. The vaccine is usually given as 2 doses, 6 to 18 months apart.  Hepatitis B. You need this vaccine if you have a specific risk factor for hepatitis B virus infection or you simply wish to be protected from this disease. The vaccine is given in 3 doses, usually over 6 months. Preventative Service / Frequency Ages 36 to 62  Blood pressure check.** / Every 1 to 2 years.  Lipid and cholesterol check.** / Every 5 years beginning at age 60.  Hepatitis C blood test.** / For any individual with known risks for hepatitis C.  Skin self-exam. / Monthly.  Influenza immunization.** / Every year.  Pneumococcal polysaccharide immunization.** / 1 to 2 doses if you smoke cigarettes or if you have certain chronic medical conditions.  Tetanus, diphtheria, pertussis (Tdap,Td) immunization. / A one-time dose of Tdap vaccine. After that, you need a Td booster dose every 10 years.  HPV immunization. / 3 doses over 6 months, if 26 and younger.  Measles, mumps, rubella (MMR) immunization. / You need at least 1 dose of MMR if you were born in 1957 or later. You may also need a 2nd dose.  Meningococcal immunization. / 1 dose if you are age 28 to 78 years and a Orthoptist living in a residence hall, or have one of several medical conditions, you need to get vaccinated against  meningococcal disease. You may also need additional booster doses.  Varicella immunization.** / Consult your caregiver.  Hepatitis A immunization.** / Consult your caregiver. 2 doses, 6 to 18 months apart.  Hepatitis B immunization.** / Consult your caregiver. 3 doses usually over 6 months. Ages 68 to 72  Blood pressure check.** / Every 1 to 2 years.  Lipid and cholesterol check.** / Every 5 years beginning at age 41.  Fecal occult blood test (FOBT) of stool. / Every year beginning at age 79 and continuing  until age 82. You may not have to do this test if you get colonoscopy every 10 years.  Flexible sigmoidoscopy** or colonoscopy.** / Every 5 years for a flexible sigmoidoscopy or every 10 years for a colonoscopy beginning at age 15 and continuing until age 48.  Hepatitis C blood test.** / For all people born from [birth year] through 1966 and any individual with known risks for hepatitis C.  Skin self-exam. / Monthly.  Influenza immunization.** / Every year.  Pneumococcal polysaccharide immunization.** / 1 to 2 doses if you smoke cigarettes or if you have certain chronic medical conditions.  Tetanus, diphtheria, pertussis (Tdap/Td) immunization.** / A one-time dose of Tdap vaccine. After that, you need a Td booster dose every 10 years.  Measles, mumps, rubella (MMR) immunization. / You need at least 1 dose of MMR if you were born in 1957 or later. You may also need a 2nd dose.  Varicella immunization.**/ Consult your caregiver.  Meningococcal immunization.** / Consult your caregiver.  Hepatitis A immunization.** / Consult your caregiver. 2 doses, 6 to 18 months apart.  Hepatitis B immunization.** / Consult your caregiver. 3 doses, usually over 6 months. Ages 70 and over  Blood pressure check.** / Every 1 to 2 years.  Lipid and cholesterol check.**/ Every 5 years beginning at age 49.  Fecal occult blood test (FOBT) of stool. / Every year beginning at age 63 and continuing until age  99. You may not have to do this test if you get colonoscopy every 10 years.  Flexible sigmoidoscopy** or colonoscopy.** / Every 5 years for a flexible sigmoidoscopy or every 10 years for a colonoscopy beginning at age 19 and continuing until age 61.  Hepatitis C blood test.** / For all people born from [birth year] through 1966 and any individual with known risks for hepatitis C.  Abdominal aortic aneurysm (AAA) screening.** / A one-time screening for ages 77 to 62 years who are current or former smokers.  Skin self-exam. / Monthly.  Influenza immunization.** / Every year.  Pneumococcal polysaccharide immunization.** / 1 dose at age 38 (or older) if you have never been vaccinated.  Tetanus, diphtheria, pertussis (Tdap, Td) immunization. / A one-time dose of Tdap vaccine if you are over 65 and have contact with an infant, are a Research scientist (physical sciences), or simply want to be protected from whooping cough. After that, you need a Td booster dose every 10 years.  Varicella immunization. ** / Consult your caregiver.  Meningococcal immunization.** / Consult your caregiver.  Hepatitis A immunization. ** / Consult your caregiver. 2 doses, 6 to 18 months apart.  Hepatitis B immunization.** / Check with your caregiver. 3 doses, usually over 6 months. **Family history and personal history of risk and conditions may change your caregiver's recommendations. Document Released: 06/16/2001 Document Revised: 07/13/2011 Document Reviewed: 09/15/2010 [facility] Patient Information 2013 [place], [place].     Insomnia Insomnia is frequent trouble falling and/or staying asleep. Insomnia can be a long term problem or a short term problem. Both are common. Insomnia can be a short term problem when the wakefulness is related to a certain stress or worry. Long term insomnia is often related to ongoing stress during waking hours and/or poor sleeping habits. Overtime, sleep deprivation itself can make the problem worse. Every  little thing feels more severe because you are overtired and your ability to cope is decreased. CAUSES   Stress, anxiety, and depression.  Poor sleeping habits.  Distractions such as TV in the bedroom.  Naps close to bedtime.  Engaging in emotionally charged conversations before bed.  Technical reading before sleep.  Alcohol and other sedatives. They may make the problem worse. They can hurt normal sleep patterns and normal dream activity.  Stimulants such as caffeine for several hours prior to bedtime.  Pain syndromes and shortness of breath can cause insomnia.  Exercise late at night.  Changing time zones may cause sleeping problems (jet lag). It is sometimes helpful to have someone observe your sleeping patterns. They should look for periods of not breathing during the night (sleep apnea). They should also look to see how long those periods last. If you live alone or observers are uncertain, you can also be observed at a sleep clinic where your sleep patterns will be professionally monitored. Sleep apnea requires a checkup and treatment. Give your caregivers your medical history. Give your caregivers observations your family has made about your sleep.  SYMPTOMS   Not feeling rested in the morning.  Anxiety and restlessness at bedtime.  Difficulty falling and staying asleep. TREATMENT   Your caregiver may prescribe treatment for an underlying medical disorders. Your caregiver can give advice or help if you are using alcohol or other drugs for self-medication. Treatment of underlying problems will usually eliminate insomnia problems.  Medications can be prescribed for short time use. They are generally not recommended for lengthy use.  Over-the-counter sleep medicines are not recommended for lengthy use. They can be habit forming.  You can promote easier sleeping by making lifestyle changes such as:  Using relaxation techniques that help with breathing and reduce muscle  tension.  Exercising earlier in the day.  Changing your diet and the time of your last meal. No night time snacks.  Establish a regular time to go to bed.  Counseling can help with stressful problems and worry.  Soothing music and white noise may be helpful if there are background noises you cannot remove.  Stop tedious detailed work at least one hour before bedtime. HOME CARE INSTRUCTIONS   Keep a diary. Inform your caregiver about your progress. This includes any medication side effects. See your caregiver regularly. Take note of:  Times when you are asleep.  Times when you are awake during the night.  The quality of your sleep.  How you feel the next day. This information will help your caregiver care for you.  Get out of bed if you are still awake after 15 minutes. Read or do some quiet activity. Keep the lights down. Wait until you feel sleepy and go back to bed.  Keep regular sleeping and waking hours. Avoid naps.  Exercise regularly.  Avoid distractions at bedtime. Distractions include watching television or engaging in any intense or detailed activity like attempting to balance the household checkbook.  Develop a bedtime ritual. Keep a familiar routine of bathing, brushing your teeth, climbing into bed at the same time each night, listening to soothing music. Routines increase the success of falling to sleep faster.  Use relaxation techniques. This can be using breathing and muscle tension release routines. It can also include visualizing peaceful scenes. You can also help control troubling or intruding thoughts by keeping your mind occupied with boring or repetitive thoughts like the old concept of counting sheep. You can make it more creative like imagining planting one beautiful flower after another in your backyard garden.  During your day, work to eliminate stress. When this is not possible use some of the previous suggestions to help reduce the anxiety that  accompanies stressful  situations. MAKE SURE YOU:   Understand these instructions.  Will watch your condition.  Will get help right away if you are not doing well or get worse. Document Released: 04/17/2000 Document Revised: 07/13/2011 Document Reviewed: 05/18/2007 [facility] Patient Information 2013 [place], [place].

## 2012-10-24 ENCOUNTER — Other Ambulatory Visit: Payer: Self-pay

## 2012-10-31 ENCOUNTER — Encounter: Payer: Self-pay | Admitting: Internal Medicine

## 2013-08-06 ENCOUNTER — Emergency Department: Payer: Self-pay | Admitting: Emergency Medicine

## 2013-08-06 LAB — BASIC METABOLIC PANEL
Anion Gap: 2 — ABNORMAL LOW (ref 7–16)
BUN: 22 mg/dL — AB (ref 7–18)
CALCIUM: 9.3 mg/dL (ref 8.5–10.1)
CREATININE: 1.23 mg/dL (ref 0.60–1.30)
Chloride: 106 mmol/L (ref 98–107)
Co2: 32 mmol/L (ref 21–32)
EGFR (African American): >60
EGFR (Non-African Amer.): >60
GLUCOSE: 103 mg/dL — AB (ref 65–99)
Osmolality: 283 (ref 275–301)
Potassium: 4.7 mmol/L (ref 3.5–5.1)
Sodium: 140 mmol/L (ref 136–145)

## 2013-08-06 LAB — CBC
HCT: 44.4 % (ref 40.0–52.0)
HGB: 14.4 g/dL (ref 13.0–18.0)
MCH: 28.4 pg (ref 26.0–34.0)
MCHC: 32.4 g/dL (ref 32.0–36.0)
MCV: 88 fL (ref 80–100)
PLATELETS: 290 10*3/uL (ref 150–440)
RBC: 5.06 10*6/uL (ref 4.40–5.90)
RDW: 13.9 % (ref 11.5–14.5)
WBC: 12.9 10*3/uL — ABNORMAL HIGH (ref 3.8–10.6)

## 2013-08-06 LAB — TROPONIN I: Troponin-I: <0.02 ng/mL

## 2013-08-08 ENCOUNTER — Telehealth: Payer: Self-pay | Admitting: Internal Medicine

## 2013-08-08 NOTE — Telephone Encounter (Signed)
Pt returned my call.  Called him back and left a message.

## 2013-08-08 NOTE — Telephone Encounter (Signed)
Left a message for the pt to return my call to give me the strength of the ranitidine.

## 2013-08-08 NOTE — Telephone Encounter (Signed)
Pt would like you to know:  Pt went to ED ([facility]) Sun am w/ chest pains and neck/shoulder pains. Pt dx w/ acid reflux. Pt would like to take the meds they prescribed: ranitidine before coming to see you for fup (3 wks,).  Pt states this was not like any acid reflux he has had before.Marland Kitchen

## 2013-08-09 NOTE — Telephone Encounter (Signed)
Ok to refill until appt if needed

## 2013-08-09 NOTE — Telephone Encounter (Signed)
Left a message for the pt to return my call.  

## 2013-08-09 NOTE — Telephone Encounter (Signed)
I spoke to [place].  He is taking the ranitidine 150 mg twice daily for one week and will then decrease to one tablet daily.  He has made a future appt to see you on 08/28/13 @ 2:30. He will call back if he needs a refill in the meantime.

## 2013-08-28 ENCOUNTER — Ambulatory Visit (INDEPENDENT_AMBULATORY_CARE_PROVIDER_SITE_OTHER): Payer: 59 | Admitting: Internal Medicine

## 2013-08-28 ENCOUNTER — Encounter: Payer: Self-pay | Admitting: Internal Medicine

## 2013-08-28 VITALS — BP 100/72 | Temp 98.3°F | Ht 67.0 in | Wt 176.0 lb

## 2013-08-28 DIAGNOSIS — Z299 Encounter for prophylactic measures, unspecified: Secondary | ICD-10-CM

## 2013-08-28 DIAGNOSIS — R7309 Other abnormal glucose: Secondary | ICD-10-CM

## 2013-08-28 DIAGNOSIS — F17200 Nicotine dependence, unspecified, uncomplicated: Secondary | ICD-10-CM

## 2013-08-28 DIAGNOSIS — R739 Hyperglycemia, unspecified: Secondary | ICD-10-CM

## 2013-08-28 DIAGNOSIS — R0789 Other chest pain: Secondary | ICD-10-CM

## 2013-08-28 DIAGNOSIS — E785 Hyperlipidemia, unspecified: Secondary | ICD-10-CM

## 2013-08-28 DIAGNOSIS — Z720 Tobacco use: Secondary | ICD-10-CM

## 2013-08-28 MED ORDER — VARENICLINE TARTRATE 1 MG PO TABS: 1.0000 mg | ORAL_TABLET | Freq: Two times a day (BID) | ORAL | Status: DC

## 2013-08-28 MED ORDER — VARENICLINE TARTRATE 0.5 MG X 11 & 1 MG X 42 PO MISC: ORAL | Status: DC

## 2013-08-28 NOTE — Progress Notes (Signed)
Chief Complaint  Patient presents with  . Follow-up    Hospital.  Pt no longer taking ranitidine as prescribed by the hospital.    HPI:,  Patient come in for follow up from ED visit about  3 weeks ago for chest pain felt to be gi in nature at [facility] ;no records in ehr found. Reported nl ekg and labs ( ? Troponin?) He developed Neck and then shoulder pain while sitting   And thenintense pressure in neck and then sharp pain in center of chest at one time and throbbing.  Lasted 3 hours   Second time and happened  4 years  and had neg evaluation in ed. . No assoc sx.   resp  Or gi  given ranitindine   And took for 3 weeks and  Wean.   And no recurrence .  So far .  Had drink some liquor   Before this event.  Has stopped  This  Works 12 hours per day  .  Not enough sleep.  Still smoke not quite ppd  And drink 6 pack per week  Average.  Has stopped in the past  ROS: See pertinent positives and negatives per HPI. No current cp sob syncope exercise intolerance nvd or wheezing  Past Medical History  Diagnosis Date  . GERD (gastroesophageal reflux disease)   . Chicken pox   . Hyperlipidemia     Family History  Problem Relation Age of Onset  . Arthritis Mother   . Hyperlipidemia Mother   . Hyperlipidemia Father   . Cancer Maternal Grandmother     Colon Cancer    History   Social History  . Marital Status: Single    Spouse Name: N/A    Number of Children: N/A  . Years of Education: N/A   Social History Main Topics  . Smoking status: Current Every Day Smoker  . Smokeless tobacco: None  . Alcohol Use: Yes  . Drug Use: None  . Sexual Activity: None   Other Topics Concern  . 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 varenicline (CHANTIX CONTINUING MONTH PAK) 1 MG tablet Take 1 tablet (1 mg total) by mouth 2 (two) times daily.  . varenicline (CHANTIX STARTING MONTH PAK) 0.5 MG X 11 & 1 MG X 42 tablet Take one 0.5 mg tablet by mouth once daily for 3 days, then increase to one 0.5 mg tablet twice daily for 4 days, then increase to one 1 mg tablet twice daily.    EXAM:  BP 100/72  Temp(Src) 98.3 F (36.8 C) (Oral)  Ht 5\' 7"  (1.702 m)  Wt 176 lb (79.833 kg)  BMI 27.56 kg/m2  Body mass index is 27.56 kg/(m^2).  GENERAL: vitals reviewed and listed above, alert, oriented,  appears well hydrated and in no acute distress HEENT: atraumatic, conjunctiva  clear, no obvious abnormalities on inspection of external nose and ears OP : no lesion edema or exudate  Tongue midline  NECK: no obvious masses on inspection palpation  LUNGS: clear to auscultation bilaterally, no wheezes, rales or rhonchi, good air movement CV: HRRR, no clubbing cyanosis or  peripheral edema nl cap refill  Abdomen:  Sof,t normal bowel sounds without hepatosplenomegaly, no guarding rebound or masses no CVA tenderness MS: moves all extremities without noticeable focal  abnormality PSYCH: pleasant and cooperative, no obvious depression or anxiety Lab Results  Component Value Date   WBC 5.5 08/24/2012   HGB 14.0 08/24/2012   HCT 40.5 08/24/2012   PLT 248.0 08/24/2012   GLUCOSE 146* 08/24/2012   CHOL 238* 08/24/2012   TRIG 159.0* 08/24/2012   HDL 51.70 08/24/2012   LDLDIRECT 168.3 08/24/2012   ALT 36 08/24/2012   AST 25 08/24/2012   NA 137 08/24/2012   K 3.5 08/24/2012   CL 103 08/24/2012   CREATININE 1.3 08/24/2012   BUN 23 08/24/2012   CO2 27  08/24/2012   TSH 0.87 08/24/2012   HGBA1C 6.0 08/26/2012    ASSESSMENT AND PLAN:  Discussed the following assessment and plan:  Atypical chest pain - could be gi has risk factores at this time observe consdier cards eval if recurrent of ? of same - Plan: Basic metabolic panel, CBC with Differential, Hemoglobin A1c, Hepatic function panel, Lipid panel, TSH  Preventive measure - Plan: Basic metabolic panel, CBC with Differential, Hemoglobin A1c, Hepatic function panel, Lipid panel, TSH  Other and unspecified hyperlipidemia - Plan: Basic metabolic panel, CBC with Differential, Hemoglobin A1c, Hepatic function panel, Lipid panel, TSH  Hyperglycemia - noted non fasting last year lab  recheck fasting this year - Plan: Basic metabolic panel, CBC with Differential, Hemoglobin A1c, Hepatic function panel, Lipid panel, TSH  tobacco use ;cessation advice  - Plan: Basic metabolic panel, CBC with Differential, Hemoglobin A1c, Hepatic function panel, Lipid panel, TSH  -Patient advised to return or notify health care team  if symptoms worsen ,persist or new concerns arise.  Patient Instructions  Doesn't sound like heart cause of your episode but you do have risk  Because of tpobacco and cholesterol levels . We also need to recheck your sugar levels.,  Tobacco cessation is advised . Can try chantix helps some people quit.   Get fasting lab appt   I will put inb orders . Recheck in any recurrence of  Chest neck sx and  Also preventive visit in 6-12 months     Yamilette Garretson K. Takashi Korol M.D.  Pre visit review using our clinic review tool, if applicable. No additional management support is needed unless otherwise documented below in the visit note.

## 2013-08-28 NOTE — Patient Instructions (Addendum)
Doesn't sound like heart cause of your episode but you do have risk  Because of tpobacco and cholesterol levels . We also need to recheck your sugar levels.,  Tobacco cessation is advised . Can try chantix helps some people quit.   Get fasting lab appt   I will put inb orders . Recheck in any recurrence of  Chest neck sx and  Also preventive visit in 6-12 months

## 2013-11-04 ENCOUNTER — Emergency Department: Payer: Self-pay | Admitting: Emergency Medicine

## 2014-02-02 ENCOUNTER — Ambulatory Visit (INDEPENDENT_AMBULATORY_CARE_PROVIDER_SITE_OTHER): Payer: 59 | Admitting: Internal Medicine

## 2014-02-02 ENCOUNTER — Encounter: Payer: Self-pay | Admitting: Internal Medicine

## 2014-02-02 VITALS — BP 114/72 | Temp 98.2°F | Ht 67.0 in | Wt 177.0 lb

## 2014-02-02 DIAGNOSIS — Z2821 Immunization not carried out because of patient refusal: Secondary | ICD-10-CM

## 2014-02-02 DIAGNOSIS — M545 Low back pain, unspecified: Secondary | ICD-10-CM

## 2014-02-02 DIAGNOSIS — Z87891 Personal history of nicotine dependence: Secondary | ICD-10-CM

## 2014-02-02 MED ORDER — CYCLOBENZAPRINE HCL 5 MG PO TABS: 5.0000 mg | ORAL_TABLET | Freq: Three times a day (TID) | ORAL | Status: DC | PRN

## 2014-02-02 NOTE — Progress Notes (Signed)
Pre visit review using our clinic review tool, if applicable. No additional management support is needed unless otherwise documented below in the visit note.  Chief Complaint  Patient presents with  . Back Pain    States he has been diagnosed in the past with a bulging disc.    HPI: Patient Henry Woods  comes in today for SDA for  new problem evaluation. In past was told he had a  Bulging disc   10 years.  Now bothering him and progressing .  First felt back issue:  Trampoline injurywhen a teen young adult  and  Didn't see any one but got better  And then building house and swinging a tool hammer or such  and missed and  Really hurt then about 2003 ; saw MD  And told had bulging .  Disc didn't  do surgery . Periodic pain but lately a problem  All the time  No current meds for this.  Gets pain in testicles when flares.  Lower  .   No leg sx weakness . Gu GI sx incontinence   Bending lifting  Aggravator .  Sleep no change.  Hard to stand up.  Better over time.  No meds .  For now [employer] job somewhat.    advil e  In past.    8- 10 /10 not now  No tobacco for one month.  Didn't use chantix  ROS: See pertinent positives and negatives per HPI.no cp sob now .  Past Medical History  Diagnosis Date  . GERD (gastroesophageal reflux disease)   . Chicken pox   . Hyperlipidemia     Family History  Problem Relation Age of Onset  . Arthritis Mother   . Hyperlipidemia Mother   . Hyperlipidemia Father   . Cancer Maternal Grandmother     Colon Cancer    History   Social History  . Marital Status: Single    Spouse Name: N/A    Number of Children: N/A  . Years of Education: N/A   Social History Main Topics  . Smoking status: Former Research scientist (life sciences)  . Smokeless tobacco: Never Used  . Alcohol Use: Yes  . Drug Use: None  . Sexual Activity: None   Other Topics Concern  . 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 cyclobenzaprine (FLEXERIL) 5 MG tablet Take 1-2 tablets (5-10 mg total) by mouth 3 (three) times daily as needed for muscle spasms.  . [DISCONTINUED] varenicline (CHANTIX CONTINUING MONTH PAK) 1 MG tablet Take 1 tablet (1 mg total) by mouth 2 (two) times daily.  . [DISCONTINUED] varenicline (CHANTIX STARTING MONTH PAK) 0.5 MG X 11 & 1 MG X 42 tablet Take one 0.5 mg tablet by mouth once  daily for 3 days, then increase to one 0.5 mg tablet twice daily for 4 days, then increase to one 1 mg tablet twice daily.    EXAM:  BP 114/72  Temp(Src) 98.2 F (36.8 C) (Oral)  Ht 5\' 7"  (1.702 m)  Wt 177 lb (80.287 kg)  BMI 27.72 kg/m2  Body mass index is 27.72 kg/(m^2).  GENERAL: vitals reviewed and listed above, alert, oriented, appears well hydrated and in no acute distress HEENT: atraumatic, conjunctiva  clear, no obvious abnormalities on inspection of external nose and ears except scaly area top right hear  MS: moves all extremities without noticeable focal  Abnormality Back tender mid line ls area  gaitl nl some pain with dorsiflexion but no tru slr   dtrs ? Dec left knee  No motor changes  Strength appears normal  PSYCH: pleasant and cooperative, no obvious depression or anxiety  ASSESSMENT AND PLAN:  Discussed the following assessment and plan:  Midline low back pain without sciatica - progressive atypical radiation to perineum test area no neur signs otherwise ref for opinoin - Plan:  Ambulatory referral to Orthopedic Surgery  Influenza vaccination declined  Ex-smoker - qu for 1 month  doing great continue did nt use chantix progressive  Pain and constant   Refer to dr Lynann Bologna ortho spine for evaluation.  reviewed diff d and poss interventions -Patient advised to return or notify health care team  if symptoms worsen ,persist or new concerns arise.  Patient Instructions  Take 2 aleve  Generic  Twice a day for about  A week and  Can add muscle relaxant  As needed .   Will plan referral  To back specialist .  Congratulation on stopping   Tobacco .     Standley Brooking. Panosh M.D.

## 2014-02-02 NOTE — Patient Instructions (Addendum)
Take 2 aleve  Generic  Twice a day for about  A week and  Can add muscle relaxant  As needed .   Will plan referral  To back specialist .  Congratulation on stopping   Tobacco .

## 2014-05-25 ENCOUNTER — Other Ambulatory Visit: Payer: Self-pay | Admitting: Internal Medicine

## 2014-05-31 NOTE — Telephone Encounter (Signed)
Ok x 1

## 2014-06-01 NOTE — Telephone Encounter (Signed)
Sent to the pharmacy by e-scribe. 

## 2014-08-16 ENCOUNTER — Telehealth: Payer: Self-pay | Admitting: Internal Medicine

## 2014-08-16 NOTE — Telephone Encounter (Signed)
Pt decline to make an app. Pt would like dr Regis Bill opinion concerning back pain issues. Pt has seen ortho in past. Pt unable to see that ortho due to change in his insurance. Misty please call

## 2014-08-16 NOTE — Telephone Encounter (Signed)
Spoke to the pt.  He has switched insurance companies and will no longer be able to see Dr. Lynann Bologna at [facility].  He has an appt on Monday with [facility].  His insurance does not provide great coverage and the visit to [facility] with be $250.  He was not referred to physical therapy in the past but has been using a trainer at his gym to help with exercising his back.  Taking 2 Aleve when needed.  Does not take everyday.  Would like to know if Dr. Regis Bill has any ideas concerning his treatment or is she will treat him due to cost of visit on Monday.  Pt notified that Dr. Velora Mediate clinic shuts down early on Thursday.  May be Friday before he gets a call.  Pt understands.

## 2014-08-17 NOTE — Telephone Encounter (Signed)
I don't have other ideas less expensive . physical therapy may help  Control  ? Prevent the pain but may have a high copay also  But if helps can do exercises on own to prevent Injections help some people but not every one and may have a cost .

## 2014-08-20 NOTE — Telephone Encounter (Signed)
Spoke to pt, told him Dr. Regis Bill said, she does not have any other ideas less expensive. Physical Therapy may help control pain, but you may have a high co-pay also, but if it helps can do exercises on own to prevent. Also injections help some people but not every one and may have a cost.  Pt verbalized understanding.

## 2015-02-11 ENCOUNTER — Emergency Department
Admission: EM | Admit: 2015-02-11 | Discharge: 2015-02-11 | Disposition: A | Discharge status: Home / Self Care | Payer: No Typology Code available for payment source | Attending: Emergency Medicine | Admitting: Emergency Medicine

## 2015-02-11 ENCOUNTER — Emergency Department: Payer: No Typology Code available for payment source

## 2015-02-11 ENCOUNTER — Encounter: Payer: Self-pay | Admitting: Emergency Medicine

## 2015-02-11 DIAGNOSIS — R945 Abnormal results of liver function studies: Secondary | ICD-10-CM | POA: Diagnosis not present

## 2015-02-11 DIAGNOSIS — R079 Chest pain, unspecified: Secondary | ICD-10-CM | POA: Diagnosis present

## 2015-02-11 DIAGNOSIS — R7989 Other specified abnormal findings of blood chemistry: Secondary | ICD-10-CM

## 2015-02-11 DIAGNOSIS — R1011 Right upper quadrant pain: Secondary | ICD-10-CM | POA: Insufficient documentation

## 2015-02-11 DIAGNOSIS — Z720 Tobacco use: Secondary | ICD-10-CM | POA: Insufficient documentation

## 2015-02-11 DIAGNOSIS — R1013 Epigastric pain: Secondary | ICD-10-CM | POA: Insufficient documentation

## 2015-02-11 DIAGNOSIS — K759 Inflammatory liver disease, unspecified: Secondary | ICD-10-CM | POA: Diagnosis not present

## 2015-02-11 DIAGNOSIS — R112 Nausea with vomiting, unspecified: Secondary | ICD-10-CM | POA: Insufficient documentation

## 2015-02-11 LAB — COMPREHENSIVE METABOLIC PANEL
ALK PHOS: 538 U/L — AB (ref 38–126)
ALT: 621 U/L — AB (ref 17–63)
AST: 222 U/L — ABNORMAL HIGH (ref 15–41)
Albumin: 4.1 g/dL (ref 3.5–5.0)
Anion gap: 8 (ref 5–15)
BUN: 14 mg/dL (ref 6–20)
CALCIUM: 9.9 mg/dL (ref 8.9–10.3)
CHLORIDE: 104 mmol/L (ref 101–111)
CO2: 27 mmol/L (ref 22–32)
CREATININE: 1.23 mg/dL (ref 0.61–1.24)
Glucose, Bld: 153 mg/dL — ABNORMAL HIGH (ref 65–99)
Potassium: 3.8 mmol/L (ref 3.5–5.1)
Sodium: 139 mmol/L (ref 135–145)
TOTAL PROTEIN: 7.4 g/dL (ref 6.5–8.1)
Total Bilirubin: 2 mg/dL — ABNORMAL HIGH (ref 0.3–1.2)

## 2015-02-11 LAB — CBC
HCT: 46.8 % (ref 40.0–52.0)
Hemoglobin: 15.7 g/dL (ref 13.0–18.0)
MCH: 29.5 pg (ref 26.0–34.0)
MCHC: 33.6 g/dL (ref 32.0–36.0)
MCV: 87.6 fL (ref 80.0–100.0)
PLATELETS: 264 10*3/uL (ref 150–440)
RBC: 5.34 MIL/uL (ref 4.40–5.90)
RDW: 13.9 % (ref 11.5–14.5)
WBC: 10 10*3/uL (ref 3.8–10.6)

## 2015-02-11 LAB — LIPASE, BLOOD: LIPASE: 32 U/L (ref 22–51)

## 2015-02-11 LAB — PROTIME-INR
INR: 0.94
PROTHROMBIN TIME: 12.8 s (ref 11.4–15.0)

## 2015-02-11 LAB — TROPONIN I

## 2015-02-11 LAB — MONONUCLEOSIS SCREEN: MONO SCREEN: NEGATIVE

## 2015-02-11 MED ORDER — OXYCODONE HCL 5 MG PO TABS: 5.0000 mg | ORAL_TABLET | Freq: Three times a day (TID) | ORAL | Status: DC | PRN

## 2015-02-11 MED ORDER — IOHEXOL 300 MG/ML  SOLN
100.0000 mL | Freq: Once | INTRAMUSCULAR | Status: AC | PRN
  Administered 2015-02-11: 100 mL via INTRAVENOUS

## 2015-02-11 MED ORDER — ONDANSETRON HCL 4 MG/2ML IJ SOLN
4.0000 mg | Freq: Once | INTRAMUSCULAR | Status: AC
  Administered 2015-02-11: 4 mg via INTRAVENOUS
  Filled 2015-02-11: qty 2

## 2015-02-11 MED ORDER — IOHEXOL 240 MG/ML SOLN
25.0000 mL | Freq: Once | INTRAMUSCULAR | Status: AC | PRN
  Administered 2015-02-11: 25 mL via ORAL

## 2015-02-11 MED ORDER — ONDANSETRON 4 MG PO TBDP: 4.0000 mg | ORAL_TABLET | Freq: Three times a day (TID) | ORAL | Status: DC | PRN

## 2015-02-11 MED ORDER — HYDROMORPHONE HCL 1 MG/ML IJ SOLN
1.0000 mg | Freq: Once | INTRAMUSCULAR | Status: AC
  Administered 2015-02-11: 1 mg via INTRAVENOUS
  Filled 2015-02-11: qty 1

## 2015-02-11 MED ORDER — SODIUM CHLORIDE 0.9 % IV BOLUS (SEPSIS)
1000.0000 mL | Freq: Once | INTRAVENOUS | Status: AC
  Administered 2015-02-11: 1000 mL via INTRAVENOUS

## 2015-02-11 NOTE — ED Notes (Signed)
Has had nausea and vomiting x 1 week, took pepcid this am without relief

## 2015-02-11 NOTE — ED Notes (Signed)
Pt ambulatory to and from restroom without incident.

## 2015-02-11 NOTE — Discharge Instructions (Signed)
As we discussed please take your medications as needed, as prescribed. Please follow-up with your primary care provider in 2-3 days for recheck of her liver function tests. These have been printed out, please bring them with you to your primary care doctor. If your liver function tests continue to be elevated, please call the number provided for GI medicine, to obtain a follow-up appointment as soon as possible. Return to the emergency department for any fever, worsening abdominal pain, or any other symptom personally concerning to yourself.   Nausea and Vomiting Nausea is a sick feeling that often comes before throwing up (vomiting). Vomiting is a reflex where stomach contents come out of your mouth. Vomiting can cause severe loss of body fluids (dehydration). Children and elderly adults can become dehydrated quickly, especially if they also have diarrhea. Nausea and vomiting are symptoms of a condition or disease. It is important to find the cause of your symptoms. CAUSES   Direct irritation of the stomach lining. This irritation can result from increased acid production (gastroesophageal reflux disease), infection, food poisoning, taking certain medicines (such as nonsteroidal anti-inflammatory drugs), alcohol use, or tobacco use.  Signals from the brain.These signals could be caused by a headache, heat exposure, an inner ear disturbance, increased pressure in the brain from injury, infection, a tumor, or a concussion, pain, emotional stimulus, or metabolic problems.  An obstruction in the gastrointestinal tract (bowel obstruction).  Illnesses such as diabetes, hepatitis, gallbladder problems, appendicitis, kidney problems, cancer, sepsis, atypical symptoms of a heart attack, or eating disorders.  Medical treatments such as chemotherapy and radiation.  Receiving medicine that makes you sleep (general anesthetic) during surgery. DIAGNOSIS Your caregiver may ask for tests to be done if the  problems do not improve after a few days. Tests may also be done if symptoms are severe or if the reason for the nausea and vomiting is not clear. Tests may include:  Urine tests.  Blood tests.  Stool tests.  Cultures (to look for evidence of infection).  X-rays or other imaging studies. Test results can help your caregiver make decisions about treatment or the need for additional tests. TREATMENT You need to stay well hydrated. Drink frequently but in small amounts.You may wish to drink water, sports drinks, clear broth, or eat frozen ice pops or gelatin dessert to help stay hydrated.When you eat, eating slowly may help prevent nausea.There are also some antinausea medicines that may help prevent nausea. HOME CARE INSTRUCTIONS   Take all medicine as directed by your caregiver.  If you do not have an appetite, do not force yourself to eat. However, you must continue to drink fluids.  If you have an appetite, eat a normal diet unless your caregiver tells you differently.  Eat a variety of complex carbohydrates (rice, wheat, potatoes, bread), lean meats, yogurt, fruits, and vegetables.  Avoid high-fat foods because they are more difficult to digest.  Drink enough water and fluids to keep your urine clear or pale yellow.  If you are dehydrated, ask your caregiver for specific rehydration instructions. Signs of dehydration may include:  Severe thirst.  Dry lips and mouth.  Dizziness.  Dark urine.  Decreasing urine frequency and amount.  Confusion.  Rapid breathing or pulse. SEEK IMMEDIATE MEDICAL CARE IF:   You have blood or brown flecks (like coffee grounds) in your vomit.  You have black or bloody stools.  You have a severe headache or stiff neck.  You are confused.  You have severe abdominal pain.  You have chest pain or trouble breathing.  You do not urinate at least once every 8 hours.  You develop cold or clammy skin.  You continue to vomit for longer  than 24 to 48 hours.  You have a fever. MAKE SURE YOU:   Understand these instructions.  Will watch your condition.  Will get help right away if you are not doing well or get worse.   This information is not intended to replace advice given to you by your health care provider. Make sure you discuss any questions you have with your health care provider.   Document Released: 04/20/2005 Document Revised: 07/13/2011 Document Reviewed: 09/17/2010 Elsevier Interactive Patient Education [employer].

## 2015-02-11 NOTE — ED Provider Notes (Signed)
[facility] Emergency Department Provider Note  Time seen: 7:26 PM  I have reviewed the triage vital signs and the nursing notes.   HISTORY  Chief Complaint Chest Pain    HPI Henry Woods is a 44 y.o. male with a past medical history of gastric reflux, hyperlipidemia, presents the emergency department for nausea, vomiting, abdominal pain. According to the patient for the past one week he has not been feeling well with upper abdominal pain, central chest pain, nausea, vomiting. Denies fever, diarrhea. States the pain is worsened today so he came to the emergency department for evaluation. Currently he ranks the pain as severe located in his upper abdomen radiating into his chest. States it feels like very bad reflux has not improved with Pepcid. States he has not noted a clear association with food but does believe that it might be worse after eating. States he has noted over the past several months occasional abdominal discomfort after eating.     Past Medical History  Diagnosis Date  . GERD (gastroesophageal reflux disease)   . Chicken pox   . Hyperlipidemia     Patient Active Problem List   Diagnosis Date Noted  . Midline low back pain without sciatica 02/02/2014  . Ex-smoker 02/02/2014  . Atypical chest pain 08/28/2013  . Other and unspecified hyperlipidemia 08/31/2012  . Hyperglycemia 08/31/2012  . Visit for preventive health examination 08/31/2012  . Allergic rhinitis, seasonal 07/29/2012    Past Surgical History  Procedure Laterality Date  . Hand surgery  1981    Current Outpatient Rx  Name  Route  Sig  Dispense  Refill  . cyclobenzaprine (FLEXERIL) 5 MG tablet      TAKE 1-2 TABLETS (5-10 MG TOTAL) BY MOUTH 3 (THREE) TIMES DAILY AS NEEDED FOR MUSCLE SPASMS.   30 tablet   0     Allergies Review of patient's allergies indicates no known allergies.  Family History  Problem Relation Age of Onset  . Arthritis Mother   .  Hyperlipidemia Mother   . Hyperlipidemia Father   . Cancer Maternal Grandmother     Colon Cancer    Social History Social History  Substance Use Topics  . Smoking status: Current Every Day Smoker -- 0.50 packs/day    Types: Cigarettes  . Smokeless tobacco: Never Used  . Alcohol Use: Yes    Review of Systems Constitutional: Negative for fever. Cardiovascular: Central chest pain. Respiratory: Negative for shortness of breath. Gastrointestinal: Upper abdominal pain. Genitourinary: Negative for dysuria. Musculoskeletal: Negative for back pain. Skin: Negative for rash. Neurological: Negative for headache 10-point ROS otherwise negative.  ____________________________________________   PHYSICAL EXAM:  VITAL SIGNS: ED Triage Vitals  Enc Vitals Group     BP 02/11/15 1807 127/84 mmHg     Pulse Rate 02/11/15 1807 90     Resp 02/11/15 1807 18     Temp 02/11/15 1807 98.6 F (37 C)     Temp Source 02/11/15 1807 Oral     SpO2 02/11/15 1807 97 %     Weight 02/11/15 1807 180 lb (81.647 kg)     Height 02/11/15 1807 5\' 8"  (1.727 m)     Head Cir --      Peak Flow --      Pain Score 02/11/15 1809 8     Pain Loc --      Pain Edu? --      Excl. in [place]? --     Constitutional: Alert and oriented. Mild  distress due to discomfort. Eyes: Normal exam ENT   Head: Normocephalic and atraumatic.   Mouth/Throat: Mucous membranes are moist. Cardiovascular: Normal rate, regular rhythm. No murmur Respiratory: Normal respiratory effort without tachypnea nor retractions. Breath sounds are clear and equal bilaterally. No wheezes/rales/rhonchi. Gastrointestinal: Soft, mild epigastric and right upper quadrant tenderness to palpation. Musculoskeletal: Nontender with normal range of motion in all extremities.  Neurologic:  Normal speech and language. No gross focal neurologic deficits are appreciated. Speech is normal. Skin:  Skin is warm, dry and intact.  Psychiatric: Mood and affect are  normal. Speech and behavior are normal. Patient exhibits appropriate insight and judgment.  ____________________________________________     RADIOLOGY  Ultrasound within normal limits CT shows several enlarged abdominal lymph nodes, otherwise within normal limits. ____________________________________________    INITIAL IMPRESSION / ASSESSMENT AND PLAN / ED COURSE  Pertinent labs & imaging results that were available during my care of the patient were reviewed by me and considered in my medical decision making (see chart for details).  Patient presents with 1 week of nausea/vomiting as well as not feeling well, which are generalized weakness/fatigue. Increasing abdominal pain radiating into his chest. Patient's labs shows significantly elevated liver function tests including alkaline phosphatase which may suggest gallbladder dysfunction. We'll proceed with an ultrasound to further evaluate. Patient denies daily Tylenol use, admits intermittent alcohol use but does not appear to be a heavy drinker. We will treat the patient's discomfort, nausea, and IV hydrate while awaiting ultrasound results.  Ultrasound is read as normal. Patient has significantly elevated LFTs. Possible isolated hepatitis, but denies any history of IV drug use, in a monogamous relationship for 20+ years, no recent seafood ingestion. We will proceed with a CT abdomen/pelvis to further evaluate. At this time the patient states he is feeling much better.  Ultrasound and CT are largely within normal limits. I discussed the patient with Dr. Allen Norris of GI medicine. He suggested obtaining a mononucleosis as well as a PT/INR test. We will obtain these labs. Otherwise he recommends following up as an outpatient with his primary care provider in 2-3 days for recheck of his liver function tests, if they continue to elevate that he will need GI medicine follow-up. I discussed splint care with the patient he is agreeable. I have also  discussed no alcohol, Tylenol, which the patient rarely takes.  ____________________________________________   FINAL CLINICAL IMPRESSION(S) / ED DIAGNOSES  Abdominal pain Chest pain Hepatitis  Harvest Dark, MD 02/11/15 2208

## 2015-02-11 NOTE — ED Notes (Signed)
Patient transported to US 

## 2015-02-11 NOTE — ED Notes (Signed)
MD at bedside. 

## 2015-02-11 NOTE — ED Notes (Signed)
MD at bedside for reeval

## 2015-02-12 ENCOUNTER — Encounter: Payer: Self-pay | Admitting: Internal Medicine

## 2015-02-12 ENCOUNTER — Ambulatory Visit (INDEPENDENT_AMBULATORY_CARE_PROVIDER_SITE_OTHER): Payer: No Typology Code available for payment source | Admitting: Internal Medicine

## 2015-02-12 VITALS — BP 116/80 | Temp 98.2°F | Ht 67.75 in | Wt 176.2 lb

## 2015-02-12 DIAGNOSIS — R1013 Epigastric pain: Secondary | ICD-10-CM | POA: Diagnosis not present

## 2015-02-12 DIAGNOSIS — B179 Acute viral hepatitis, unspecified: Secondary | ICD-10-CM

## 2015-02-12 DIAGNOSIS — K7200 Acute and subacute hepatic failure without coma: Secondary | ICD-10-CM

## 2015-02-12 DIAGNOSIS — R112 Nausea with vomiting, unspecified: Secondary | ICD-10-CM

## 2015-02-12 NOTE — Patient Instructions (Signed)
Will notify you  of labs when available. Please stop anything or substance that can negatively affect the liver. This includes alcohol acetaminophen or Tylenol products Advil Aleve-type products and any herbals of because we don't know if they can affect the liver. It is okay to take Pepcid and neck Nexium We are checking for causes of acute hepatitis outside of the above in [place]. We may need to get gastroenterology to see you. I will review chart further for any other additional recommendations. We will plan follow-up depending on the results of these tests. Your heart sounds good today normal exam and no evidence of heart damage when he went to the emergency room.

## 2015-02-12 NOTE — Progress Notes (Signed)
Pre visit review using our clinic review tool, if applicable. No additional management support is needed unless otherwise documented below in the visit note.  Chief Complaint  Patient presents with  . Follow-up    ed    HPI: Patient come in for follow up from ED visit yesterday because of abdominal pain GER reflux symptoms and vomiting. He also had chest pain mid into the left. Evaluation was done see below. His liver function tests were significantly abnormal including an elevated alkaline phosphatase and transaminase. Imaging studies and ultrasound were negative for cholestasis or obstruction.  There was some abdominal adenopathy consistent with mesenteric adenitis. INR was normal Monospot negative. He comes in today as directed next day after ED discharge.  He has some ongoing nausea but no vomiting some pain better than yesterday.   He has been having some intermittent what he calls GE reflux symptoms for a few months high epigastric pain responding to Pepcid and Nexium over day or 2. He states he's been sick a couple times last months with a respiratory coughing illness without associated fever.  This time he had some reflux symptoms and the day of the emergency room visit he had some vomiting and increasing pain.  He only takes an occasional Tylenol Advil Aleve product but did take some cold medicine such as NyQuil and Tylenol Cold when he had the respiratory infection. She did not go over what was on the bottle and did not take acetaminophen and ibuprofen Aleve separately.  Saturday night before this vomiting he had 3-4 beers and perhaps a quarter of a [place] moonshine that was bought over-the-counter.  He reports only episodic drinking 3-4 beers but not every day and not even every week.  He takes tramadol about 3 days a week for his spinal pain but no Tylenol.  [facility]  He had been taking an herbal that they just took off the market because" it was too close to an opiate "called  Kratom      Patient presents with 1 week of nausea/vomiting as well as not feeling well, which are generalized weakness/fatigue. Increasing abdominal pain radiating into his chest. Patient's labs shows significantly elevated liver function tests including alkaline phosphatase which may suggest gallbladder dysfunction. We'll proceed with an ultrasound to further evaluate. Patient denies daily Tylenol use, admits intermittent alcohol use but does not appear to be a heavy drinker. We will treat the patient's discomfort, nausea, and IV hydrate while awaiting ultrasound results.  Ultrasound is read as normal. Patient has significantly elevated LFTs. Possible isolated hepatitis, but denies any history of IV drug use, in a monogamous relationship for 20+ years, no recent seafood ingestion. We will proceed with a CT abdomen/pelvis to further evaluate. At this time the patient states he is feeling much better.  Ultrasound and CT are largely within normal limits. I discussed the patient with Dr. Allen Norris of GI medicine. He suggested obtaining a mononucleosis as well as a PT/INR test. We will obtain these labs. Otherwise he recommends following up as an outpatient with his primary care provider in 2-3 days for recheck of his liver function tests, if they continue to elevate that he will need GI medicine follow-up. I discussed splint care with the patient he is agreeable. I have also discussed no alcohol, Tylenol, which the patient rarely takes.  ____________________________________________   FINAL CLINICAL IMPRESSION(S) / ED DIAGNOSES  Abdominal pain Chest pain Hepatitis  Harvest Dark, MD 02/11/15 2208  ROS: See pertinent positives and negatives per HPI. Negative fever chills diarrhea other people sick with GI illness. 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 This was 20 years ago. States he was getting palpitations or skipped beats when he went  on his trip to the beach and has made an appointment with [place]. No racing heart   resolving respiratory infection has a minor cough but no shortness of breath. Past Medical History  Diagnosis Date  . GERD (gastroesophageal reflux disease)   . Chicken pox   . Hyperlipidemia     Family History  Problem Relation Age of Onset  . Arthritis Mother   . Hyperlipidemia Mother   . Hyperlipidemia Father   . Cancer Maternal Grandmother     Colon Cancer    Social History   Social History  . Marital Status: Single    Spouse Name: N/A  . Number of Children: N/A  . Years of Education: N/A   Social History Main Topics  . Smoking status: Current Every Day Smoker -- 0.50 packs/day    Types: Cigarettes  . Smokeless tobacco: Never Used  . Alcohol Use: Yes  . Drug Use: None  . Sexual Activity: Not Asked   Other Topics Concern  . 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 ondansetron (ZOFRAN ODT) 4 MG disintegrating tablet Take 1 tablet (4 mg total) by mouth every 8 (eight) hours as needed for nausea or vomiting. 20 tablet 0  . oxyCODONE (ROXICODONE) 5 MG immediate release tablet Take 1 tablet (5 mg total) by mouth every 8 (eight) hours as needed. 20 tablet 0  . cyclobenzaprine (FLEXERIL) 5 MG tablet TAKE 1-2 TABLETS (5-10 MG TOTAL) BY MOUTH 3 (THREE) TIMES DAILY AS NEEDED FOR MUSCLE SPASMS. 30 tablet 0   No  facility-administered medications prior to visit.     EXAM:  BP 116/80 mmHg  Temp(Src) 98.2 F (36.8 C) (Oral)  Ht 5' 7.75" (1.721 m)  Wt 176 lb 3.2 oz (79.924 kg)  BMI 26.98 kg/m2  Body mass index is 26.98 kg/(m^2).  GENERAL: vitals reviewed and listed above, alert, oriented, appears well hydrated and in no acute distress color is nonicteric he does have some hyperemic cheeks. Normal turgor no striae or unusual rashes. HEENT: atraumatic, conjunctiva  clear, nonicteric no obvious abnormalities on inspection of external nose and ears OP : no lesion edema or exudate  NECK: no obvious masses on inspection palpation  LUNGS: clear to auscultation bilaterally, no wheezes, rales or rhonchi, good air movement CV: HRRR, no clubbing cyanosis or  peripheral edema nl cap refill  Abdomen soft without megaly guarding or rebound points to high epigastric area as the area that  he was uncomfortable previously. Bowel sounds are normoactive. MS: moves all extremities without noticeable focal  abnormality PSYCH: pleasant and cooperative, no obvious depression or anxiety Neuro nonfocal grossly. Lab Results  Component Value Date   WBC 10.0 02/11/2015   HGB 15.7 02/11/2015   HCT 46.8 02/11/2015   PLT 264 02/11/2015   GLUCOSE 153* 02/11/2015   CHOL 238* 08/24/2012   TRIG 159.0* 08/24/2012   HDL 51.70 08/24/2012   LDLDIRECT 168.3 08/24/2012   ALT 621* 02/11/2015   AST 222* 02/11/2015   NA 139 02/11/2015   K 3.8 02/11/2015   CL 104 02/11/2015   CREATININE 1.23 02/11/2015   BUN 14 02/11/2015   CO2 27 02/11/2015   TSH 0.87 08/24/2012   INR 0.94 02/11/2015   HGBA1C 6.0 08/26/2012     ASSESSMENT AND PLAN:  Discussed the following assessment and plan:  Nausea and vomiting, intractability of vomiting not specified, unspecified vomiting type  Acute hepatitis - trans aminitis and alk phos bili nl biliary us/ ct  - Plan: Gamma GT, Hepatic function panel, TSH, T4, free, Epstein-Barr virus VCA  antibody panel, CMV IgM, Hepatitis Acute Panel, Sedimentation rate  Abdominal pain, epigastric   No evidence of liver failure INR was normal no bleeding. Repeat lab tests today with some serologies and then plan follow-up.  GE reflux symptoms chest pain non-acute cardiac rule out in ED plus acute hepatitis with obstructive pattern. However abdominal ultrasound and CT were unrevealing as to the cause. Would be majorly concerned about any kind of medications or supplements although they don't seem to correlate with the timing. He did have alcohol before the night before but doesn't seem to be an off to cause the above problem.-Or perhaps in combination. Patient advised to return or notify health care team  if symptoms worsen ,persist or new concerns arise.In the meantime  plan GI consult after labs are back.  Patient Instructions  Will notify you  of labs when available. Please stop anything or substance that can negatively affect the liver. This includes alcohol acetaminophen or Tylenol products Advil Aleve-type products and any herbals of because we don't know if they can affect the liver. It is okay to take Pepcid and neck Nexium We are checking for causes of acute hepatitis outside of the above in [place]. We may need to get gastroenterology to see you. I will review chart further for any other additional recommendations. We will plan follow-up depending on the results of these tests. Your heart sounds good today normal exam and no evidence of heart damage when he went to the emergency room.     Standley Brooking. Panosh M.D.  Published Date: 11/26/2014 Type: [facility] Import Alert Name: DETENTION WITHOUT PHYSICAL EXAMINATION OF DIETARY SUPPLEMENTS AND BULK DIETARY INGREDIENTS THAT ARE OR CONTAIN MITRAGYNA [place]  Reason for Alert: FDA has seen an increase in the number of shipments of dietary supplements and bulk dietary ingredients that are, or contain kratom, also known as Mitragyna  speciosa, mitragynine extract, biak-biak, cratom, gratom, ithang, [place], [place], [place], [place], [place], [place], [place], [place], [employer], [place], [place] [employer], or [place]. These shipments of kratom have come in a variety of forms, including capsules, whole leaves, processed leaves, leaf resins, leaf extracts, powdered leaves, and bulk liquids made of leaf extracts. Importers' websites have sometimes contained information about how their products are used.    Kratom is a botanical that qualifies as a dietary ingredient under section 201(ff)(1) of the [employer], Drug, and  Cosmetic Act (the Act) [21 U.S.C. 321(ff)(1)]. When marketed as a dietary ingredient, FDA also considers kratom to be a new dietary ingredient under section 413(d) of the Act [21 U.S.C. 350b(d)] because, to the best of the agency's knowledge, there is no information demonstrating that this substance was marketed as a dietary ingredient in the [place] before February 15, 1993.     Furthermore, based on FDA's review of the publicly available information regarding kratom, there does not appear to be a history of use or other evidence of safety establishing that kratom will reasonably be expected to be safe as a dietary ingredient. In fact, the scientific literature disclosed serious concerns regarding the toxicity of kratom in multiple organ systems. Consumption of kratom can lead to a number of health impacts, including respiratory depression, nervousness, agitation, aggression, sleeplessness, hallucinations, delusions, tremors, loss of libido, constipation, skin hyperpigmentation, nausea, vomiting, and severe withdrawal signs and symptoms. In the absence of a history of use or other evidence of safety establishing that kratom will reasonably be expected to be safe as a dietary ingredient, kratom and kratom-containing dietary supplements and bulk dietary ingredients are adulterated under section 402(f)(1)(B) of the Act [21  U.S.C. 342(f)(1)(B)], because they contain a new dietary ingredient for which there is inadequate information to provide reasonable assurance that such ingredient does not present a significant or unreasonable risk of illness or injury.Published Date: 11/26/2014 Type: DWPE Import Alert Name: DETENTION WITHOUT PHYSICAL EXAMINATION OF DIETARY SUPPLEMENTS AND BULK DIETARY INGREDIENTS THAT ARE OR CONTAIN MITRAGYNA [place]  Reason for Alert: FDA has seen an increase in the number of shipments of dietary supplements and bulk dietary ingredients that are, or contain kratom, also known as Mitragyna speciosa, mitragynine extract, biak-biak, cratom, gratom, ithang, [place], [place], [place], [place], [place], [place], [place], [place], [employer], [place], [place] [employer]ios[employer], or [place]. These shipments of kratom have come in a variety of forms, including capsules, whole leaves, processed leaves, leaf resins, leaf extracts, powdered leaves, and bulk liquids made of leaf extracts. Importers' websites have sometimes contained information about how their products are used.    Kratom is a botanical that qualifies as a dietary ingredient under section 201(ff)(1) of the [employer], Drug, and Cosmetic Act (the Act) [21 U.S.C. 321(ff)(1)]. When marketed as a dietary ingredient, FDA also considers kratom to be a new dietary ingredient under section 413(d) of the Act [21 U.S.C. 350b(d)] because, to the best of the agency's knowledge, there is no information demonstrating that this substance was marketed as a dietary ingredient in the [place] before February 15, 1993.     Furthermore, based on FDA's review of the publicly available information regarding kratom, there does not appear to be a history of use or other evidence of safety establishing that kratom will reasonably be expected to be safe as a dietary ingredient. In fact, the scientific literature disclosed serious concerns regarding the toxicity of kratom  in multiple organ systems. Consumption of kratom can lead to a number of health impacts, including respiratory depression, nervousness, agitation, aggression, sleeplessness, hallucinations, delusions, tremors, loss of libido, constipation, skin hyperpigmentation, nausea, vomiting, and severe withdrawal signs and symptoms. In the absence of a history of use or other evidence of safety establishing that kratom will reasonably be expected to be safe as a dietary ingredient, kratom and kratom-containing dietary supplements and bulk dietary ingredients are adulterated under section 402(f)(1)(B) of the Act [21 U.S.C. 342(f)(1)(B)], because they contain a new dietary ingredient for which there is  inadequate information to provide reasonable assurance that such ingredient does not present a significant or unreasonable risk of illness or injury.

## 2015-02-13 ENCOUNTER — Telehealth: Payer: Self-pay | Admitting: Internal Medicine

## 2015-02-13 DIAGNOSIS — R7989 Other specified abnormal findings of blood chemistry: Secondary | ICD-10-CM

## 2015-02-13 DIAGNOSIS — R945 Abnormal results of liver function studies: Secondary | ICD-10-CM

## 2015-02-13 DIAGNOSIS — R112 Nausea with vomiting, unspecified: Secondary | ICD-10-CM

## 2015-02-13 LAB — GAMMA GT: GGT: 258 U/L — ABNORMAL HIGH (ref 7–51)

## 2015-02-13 LAB — HEPATIC FUNCTION PANEL
ALK PHOS: 581 U/L — AB (ref 39–117)
ALT: 438 U/L — AB (ref 0–53)
AST: 115 U/L — AB (ref 0–37)
Albumin: 4.1 g/dL (ref 3.5–5.2)
BILIRUBIN TOTAL: 2 mg/dL — AB (ref 0.2–1.2)
Bilirubin, Direct: 1.1 mg/dL — ABNORMAL HIGH (ref 0.0–0.3)
Total Protein: 6.7 g/dL (ref 6.0–8.3)

## 2015-02-13 LAB — T4, FREE: Free T4: 1.07 ng/dL (ref 0.60–1.60)

## 2015-02-13 LAB — HEPATITIS PANEL, ACUTE
HCV Ab: NEGATIVE
HEP A IGM: NONREACTIVE
Hep B C IgM: NONREACTIVE
Hepatitis B Surface Ag: NEGATIVE

## 2015-02-13 LAB — EPSTEIN-BARR VIRUS VCA ANTIBODY PANEL
EBV EA IgG: <5 U/mL (ref <9.0)
EBV NA IgG: <3 U/mL (ref <18.0)
EBV VCA IGG: 175 U/mL — AB (ref <18.0)

## 2015-02-13 LAB — SEDIMENTATION RATE: Sed Rate: 22 mm/hr (ref 0–22)

## 2015-02-13 LAB — TSH: TSH: 0.7 u[IU]/mL (ref 0.35–4.50)

## 2015-02-13 NOTE — Telephone Encounter (Signed)
Pt would like blood work results °

## 2015-02-14 NOTE — Telephone Encounter (Signed)
See result note   Refer and fu  Labs as per note

## 2015-02-14 NOTE — Telephone Encounter (Signed)
Pt notified of results by telephone.  Referral placed in the system.  Lab work also placed in the system.  Pt made appt for 18th at 9:45.

## 2015-02-15 ENCOUNTER — Encounter: Payer: Self-pay | Admitting: Gastroenterology

## 2015-02-15 LAB — CMV IGM: CMV IgM: 19.6 AU/mL (ref <30.00)

## 2015-02-15 LAB — MITOCHONDRIAL/SMOOTH MUSCLE AB PNL
MITOCHONDRIAL M2 AB, IGG: 0.35 (ref <0.91)
Smooth Muscle Ab: 4 U (ref <20)

## 2015-02-19 ENCOUNTER — Other Ambulatory Visit (INDEPENDENT_AMBULATORY_CARE_PROVIDER_SITE_OTHER): Payer: No Typology Code available for payment source

## 2015-02-19 ENCOUNTER — Telehealth: Payer: Self-pay | Admitting: Internal Medicine

## 2015-02-19 DIAGNOSIS — R7989 Other specified abnormal findings of blood chemistry: Secondary | ICD-10-CM | POA: Diagnosis not present

## 2015-02-19 DIAGNOSIS — R945 Abnormal results of liver function studies: Secondary | ICD-10-CM

## 2015-02-19 LAB — HEPATIC FUNCTION PANEL
ALT: 143 U/L — AB (ref 0–53)
AST: 32 U/L (ref 0–37)
Albumin: 4.3 g/dL (ref 3.5–5.2)
Alkaline Phosphatase: 339 U/L — ABNORMAL HIGH (ref 39–117)
BILIRUBIN TOTAL: 0.7 mg/dL (ref 0.2–1.2)
Bilirubin, Direct: 0.2 mg/dL (ref 0.0–0.3)
Total Protein: 7.1 g/dL (ref 6.0–8.3)

## 2015-02-19 NOTE — Telephone Encounter (Signed)
Patient would like to get checked for lyme disease. Please give patient a call to discuss this further.

## 2015-02-19 NOTE — Telephone Encounter (Signed)
Spoke to the pt.  He denies joint swelling or stiffness.  No fever or bull's-eye type rash. Does have fatigue but has been ill. Has removed multiple ticks in the past month. Please advise.  Thanks!

## 2015-02-20 NOTE — Telephone Encounter (Signed)
   Not  Usually helpful to check lab if no sx      Most ticks dont carry disease and  the lab tests are often not definitive   dont think his illness causeed but tick related disease .  However if rever rash  Atypical joint swelling  Get back with [place]   LAB liver tests are improving   Keep fy appt

## 2015-02-20 NOTE — Telephone Encounter (Signed)
Pt.notified

## 2015-02-22 ENCOUNTER — Other Ambulatory Visit: Payer: No Typology Code available for payment source

## 2015-02-26 ENCOUNTER — Encounter: Payer: Self-pay | Admitting: Gastroenterology

## 2015-02-27 ENCOUNTER — Encounter: Payer: Self-pay | Admitting: Internal Medicine

## 2015-03-07 ENCOUNTER — Encounter: Payer: Self-pay | Admitting: Gastroenterology

## 2015-03-07 ENCOUNTER — Other Ambulatory Visit (INDEPENDENT_AMBULATORY_CARE_PROVIDER_SITE_OTHER): Payer: No Typology Code available for payment source

## 2015-03-07 ENCOUNTER — Ambulatory Visit (INDEPENDENT_AMBULATORY_CARE_PROVIDER_SITE_OTHER): Payer: No Typology Code available for payment source | Admitting: Gastroenterology

## 2015-03-07 VITALS — BP 100/80 | HR 88 | Ht 67.0 in | Wt 175.6 lb

## 2015-03-07 DIAGNOSIS — R7989 Other specified abnormal findings of blood chemistry: Secondary | ICD-10-CM | POA: Diagnosis not present

## 2015-03-07 DIAGNOSIS — R945 Abnormal results of liver function studies: Principal | ICD-10-CM

## 2015-03-07 LAB — COMPREHENSIVE METABOLIC PANEL WITH GFR
ALT: 23 U/L (ref 0–53)
AST: 18 U/L (ref 0–37)
Albumin: 4.4 g/dL (ref 3.5–5.2)
Alkaline Phosphatase: 136 U/L — ABNORMAL HIGH (ref 39–117)
BUN: 32 mg/dL — ABNORMAL HIGH (ref 6–23)
CO2: 31 meq/L (ref 19–32)
Calcium: 10.4 mg/dL (ref 8.4–10.5)
Chloride: 103 meq/L (ref 96–112)
Creatinine, Ser: 1.25 mg/dL (ref 0.40–1.50)
GFR: 66.23 mL/min
Glucose, Bld: 92 mg/dL (ref 70–99)
Potassium: 4 meq/L (ref 3.5–5.1)
Sodium: 140 meq/L (ref 135–145)
Total Bilirubin: 0.4 mg/dL (ref 0.2–1.2)
Total Protein: 7.5 g/dL (ref 6.0–8.3)

## 2015-03-07 NOTE — Patient Instructions (Addendum)
You will have labs checked today in the basement lab.  Please head down after you check out with the front desk  (cmet, ANA, ceruloplasm level, Hepatitis A total, hepatitis B Core Antibody IgM, Hepatitis B core Antibody). Omeprazole daily for 6 weeks, then every other day for 2 weeks, then stop

## 2015-03-07 NOTE — Progress Notes (Signed)
HPI: This is a  very pleasant 44 year old man   who was referred to me by Burnis Medin, MD  to evaluate  chest pain neck pain, elevated liver tests .    Chief complaint is Chest pains, neck pains.  Starts in left side of neck, sometimes neck, then chest.  Starts in neck, not a burning pain.   Started when he started smoking again, 2 months ago.   excersion does not bring it on  Omeprazole, started 3 weeks ago.    Does not get pyrosis.  No dysphagia.  Overall stable weight.  Alleve 1 per month.  Has arthritis in back.  Started with a vomiting illness.  Takes omeprazole,   Took oxy for the pain.    CT scan and abdominal ultrasound October 2016 were both essentially normal.  Around the time of these pains, nausea, vomiting his liver tests were noted to be significantly elevated including alkaline phosphatase, transaminases. More recent lab tests that shown significant improvement in the liver test however his alkaline phosphatase remained elevated in the 300s. Hepatitis A, B, C was negative, AMA was negative, EBV and CMV titers suggested no acute infection.  Review of systems: Pertinent positive and negative review of systems were noted in the above HPI section. Complete review of systems was performed and was otherwise normal.   Past Medical History  Diagnosis Date  . GERD (gastroesophageal reflux disease)   . Chicken pox   . Hyperlipidemia   . Arthritis     Past Surgical History  Procedure Laterality Date  . Hand surgery Left 1981    Current Outpatient Prescriptions  Medication Sig Dispense Refill  . aspirin 325 MG tablet Take 325 mg by mouth daily as needed.    Marland Kitchen omeprazole (PRILOSEC OTC) 20 MG tablet Take 20 mg by mouth daily.    Marland Kitchen oxyCODONE (ROXICODONE) 5 MG immediate release tablet Take 1 tablet (5 mg total) by mouth every 8 (eight) hours as needed. (Patient not taking: Reported on 03/07/2015) 20 tablet 0   No current facility-administered medications for this  visit.    Allergies as of 03/07/2015  . (No Known Allergies)    Family History  Problem Relation Age of Onset  . Arthritis Mother   . Hyperlipidemia Mother   . Hyperlipidemia Father   . Colon cancer Maternal Grandmother   . Colon polyps Mother   . Colon polyps Maternal Grandmother   . Colon polyps Maternal Uncle   . Prostate cancer Maternal Uncle   . Diabetes Paternal Uncle   . Diabetes Paternal Aunt   . Diabetes Paternal Grandmother   . Prostate cancer Paternal Grandfather     Social History   Social History  . Marital Status: Single    Spouse Name: N/A  . Number of Children: N/A  . Years of Education: N/A   Occupational History  . Not on file.   Social History Main Topics  . Smoking status: Former Smoker -- 0.50 packs/day    Types: Cigarettes  . Smokeless tobacco: Never Used     Comment: currently on patch  . Alcohol Use: No     Comment: in the past  . Drug Use: No  . Sexual Activity: Not on file   Other Topics Concern  . 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 looking into going back to school for engineering or other jobs   etoh weekend    Tobacco  1/2pp d for months again from stress at work   Prev 20 pack year stopped  Sept 15   Smoke alarm neg FA    Last td 2013                  Physical Exam: BP 100/80 mmHg  Pulse 88  Ht 5\' 7"  (1.702 m)  Wt 175 lb 9.6 oz (79.652 kg)  BMI 27.50 kg/m2 Constitutional: generally well-appearing Psychiatric: alert and oriented x3 Eyes: extraocular movements intact Mouth: oral pharynx moist, no lesions Neck: supple no lymphadenopathy Cardiovascular: heart regular rate and rhythm Lungs: clear to auscultation bilaterally Abdomen: soft,  nontender, nondistended, no obvious ascites, no peritoneal signs, normal bowel sounds Extremities: no lower extremity edema bilaterally Skin: no lesions on visible extremities   Assessment and plan: 44 y.o. male with chest pain neck pain, significantly elevated liver tests after a vomiting illness  I do not think that he had alcoholic hepatitis. His LFTs were too high and not in the correct, normal alcohol rate related ratio. His alkaline phosphatase was also pretty elevated and his total bili was slightly up also. Acute hepatitis panel was negative but recheck some of these labs as well as other tests for chronic liver disease, see those summarized below knee instructions. He will sit a basic set of liver tests rechecked today. I think even if they're completely normal today I would recommend he have a repeat set of liver tests about 3 months just to confirm that they are not intermittently rising. I recommended he stay on proton pump inhibitor once daily for another 6 weeks and then go to every other day for 2 weeks and then stop. 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   Owens Loffler, MD [facility] 03/07/2015, 3:26 PM  Cc: Burnis Medin, MD

## 2015-03-08 LAB — HEPATITIS B CORE ANTIBODY, TOTAL: Hep B Core Total Ab: NONREACTIVE

## 2015-03-08 LAB — HEPATITIS B CORE ANTIBODY, IGM: Hep B C IgM: NONREACTIVE

## 2015-03-08 LAB — ANA: Anti Nuclear Antibody(ANA): NEGATIVE

## 2015-03-08 LAB — HEPATITIS A ANTIBODY, TOTAL: HEP A TOTAL AB: NONREACTIVE

## 2015-03-11 ENCOUNTER — Ambulatory Visit: Payer: No Typology Code available for payment source | Admitting: Gastroenterology

## 2015-03-11 LAB — CERULOPLASMIN: CERULOPLASMIN: 26 mg/dL (ref 18–36)

## 2015-03-13 ENCOUNTER — Other Ambulatory Visit: Payer: Self-pay | Admitting: *Deleted

## 2015-03-13 DIAGNOSIS — R945 Abnormal results of liver function studies: Principal | ICD-10-CM

## 2015-03-13 DIAGNOSIS — R7989 Other specified abnormal findings of blood chemistry: Secondary | ICD-10-CM

## 2015-04-16 ENCOUNTER — Ambulatory Visit: Payer: No Typology Code available for payment source | Admitting: Gastroenterology

## 2015-05-07 ENCOUNTER — Encounter: Payer: Self-pay | Admitting: Internal Medicine

## 2015-05-07 NOTE — Progress Notes (Signed)
Document opened and reviewed for cpx OV but appt  canceled same day .

## 2015-07-10 ENCOUNTER — Telehealth: Payer: Self-pay | Admitting: Internal Medicine

## 2015-07-10 NOTE — Telephone Encounter (Signed)
There is no rash.  There is a swollen area that is increasing in size.  It is red, hard, and warm to the touch.  Advised patient not to wait until tomorrow and to go to urgent care.  Patient is aware and agrees.

## 2015-07-10 NOTE — Progress Notes (Signed)
Pre visit review using our clinic review tool, if applicable. No additional management support is needed unless otherwise documented below in the visit note.  Chief Complaint  Patient presents with  . Urgent Care Follow Up    Pt stated he was diagnosed with cellulitis    HPI: Henry Woods 45 y.o.  comes in today for SDA for  new problem evaluation. Onset like a bug bit awaken on 4 daya agio and swelling adn redness right forehead and scalp progressing no fever but  Feels achy today tired predated this illness went ot [facility] given keflex 500 tida has had 2 doses  And topical Bactroban  Some increase in redness and swelling    Cellulitis   Tired  Feels flu like today .   No fever.   No vision change   ROS: See pertinent positives and negatives per HPI.  Asks a bout sleep issues  Insomnia   Awakenings  Poss stress   Busy as works 7 days per week   If uses etoh helps to fall asleep but awakens 2 am asks about  advisability of sleep aids like ambien   Last liver tests  Nl  Still fighting insurance co that didn want to pay the ed bill  But doing ok  Doesn't use etoh very often   Past Medical History  Diagnosis Date  . GERD (gastroesophageal reflux disease)   . Chicken pox   . Hyperlipidemia   . Arthritis     Family History  Problem Relation Age of Onset  . Arthritis Mother   . Hyperlipidemia Mother   . Hyperlipidemia Father   . Colon cancer Maternal Grandmother   . Colon polyps Mother   . Colon polyps Maternal Grandmother   . Colon polyps Maternal Uncle   . Prostate cancer Maternal Uncle   . Diabetes Paternal Uncle   . Diabetes Paternal Aunt   . Diabetes Paternal Grandmother   . Prostate cancer Paternal Grandfather     Social History   Social History  . Marital Status: Single    Spouse Name: N/A  . Number of Children: N/A  . Years of Education: N/A   Social History Main Topics  . Smoking status: Former Smoker -- 0.50 packs/day    Types: Cigarettes  . Smokeless  tobacco: Never Used     Comment: currently on patch  . Alcohol Use: No     Comment: in the past  . Drug Use: No  . Sexual Activity: Not Asked   Other Topics Concern  . 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 aspirin 325 MG tablet Take 325 mg by mouth daily as needed.    Marland Kitchen omeprazole (PRILOSEC OTC) 20 MG tablet Take  20 mg by mouth daily.    Marland Kitchen oxyCODONE (ROXICODONE) 5 MG immediate release tablet Take 1 tablet (5 mg total) by mouth every 8 (eight) hours as needed. (Patient not taking: Reported on 03/07/2015) 20 tablet 0   No facility-administered medications prior to visit.     EXAM:  BP 110/70 mmHg  Temp(Src) 98.4 F (36.9 C) (Oral)  Wt 176 lb 12.8 oz (80.196 kg)  Body mass index is 27.68 kg/(m^2).  GENERAL: vitals reviewed and listed above, alert, oriented, appears well hydrated and in no acute distress  Obvious redness and swelling over right forehaeda  upt toscalp and  Down right lateral pace pre auricular .  HEENT: atraumatic, conjunctiva  clear, no obvious abnormalities on inspection of external nose and ears OP : no lesion edema or exudate  NECK: no obvious masses on inspection palpation  Tender ac node and pre auric on right   skin no vesicles   Scalp clear except  Healing  ? puncure scratch area right scalp and forehead  Non icteric  MS: moves all extremities without noticeable focal  abnormality PSYCH:  pleasant and cooperative, no obvious depression or anxiety Lab Results  Component Value Date   WBC 10.0 02/11/2015   HGB 15.7 02/11/2015   HCT 46.8 02/11/2015   PLT 264 02/11/2015   GLUCOSE 92 03/07/2015   CHOL 238* 08/24/2012   TRIG 159.0* 08/24/2012   HDL 51.70 08/24/2012   LDLDIRECT 168.3 08/24/2012   ALT 23 03/07/2015   AST 18 03/07/2015   NA 140 03/07/2015   K 4.0 03/07/2015   CL 103 03/07/2015   CREATININE 1.25 03/07/2015   BUN 32* 03/07/2015   CO2 31 03/07/2015   TSH 0.70 02/12/2015   INR 0.94 02/11/2015   HGBA1C 6.0 08/26/2012    ASSESSMENT AND PLAN:  Discussed the following assessment and plan:  Cellulitis, face - right forehead  scalp  see text ? initiaor vs ins bite pain no abscess  no evidence of zoster. - Plan: cefTRIAXone (ROCEPHIN) injection 1 g  Insomnia intermittent - dsic strategies return for cpx and can discuss more  This has been fully explained to the patient, who indicates understanding. As when to seek emergent care can seeDr  Sarajane Jews next week in my absence if needed  -Patient advised to return or notify health care team  if symptoms worsen ,persist or new concerns arise.  Patient Instructions  Injection of antibiotic today   Increase the keflex to every 6 hours  Add additional antibiotic   Doxycycline  For 10 days .  Can rechek in Saturday am clinic call ahead  Or next week a t end of medication  ( other orivider)  Need to plan for fu liver tests routine  After better .  Attend to sleep hygiene   In the future  Sleep aids are a short term help with  Sleep plan    Dark at night avoid alcohol caffiene   4-6 hours before bed avoid back lighting   Can reset the clock     Insomnia Insomnia is a sleep disorder that makes it difficult to fall asleep or to stay asleep. Insomnia can cause tiredness (fatigue), low energy, difficulty concentrating, mood swings, and poor performance at work or school.  There are three different ways to classify  insomnia:  Difficulty falling asleep.  Difficulty staying asleep.  Waking up too early in the morning. Any type of insomnia can be long-term (chronic) or short-term (acute). Both are common. Short-term  insomnia usually lasts for three months or less. Chronic insomnia occurs at least three times a week for longer than three months. CAUSES  Insomnia may be caused by another condition, situation, or substance, such as:  Anxiety.  Certain medicines.  Gastroesophageal reflux disease (GERD) or other gastrointestinal conditions.  Asthma or other breathing conditions.  Restless legs syndrome, sleep apnea, or other sleep disorders.  Chronic pain.  Menopause. This may include hot flashes.  Stroke.  Abuse of alcohol, tobacco, or illegal drugs.  Depression.  Caffeine.   Neurological disorders, such as Alzheimer disease.  An overactive thyroid (hyperthyroidism). The cause of insomnia may not be known. RISK FACTORS Risk factors for insomnia include:  Gender. Women are more commonly affected than men.  Age. Insomnia is more common as you get older.  Stress. This may involve your professional or personal life.  Income. Insomnia is more common in people with lower income.  Lack of exercise.   Irregular work schedule or night shifts.  Traveling between different time zones. SIGNS AND SYMPTOMS If you have insomnia, trouble falling asleep or trouble staying asleep is the main symptom. This may lead to other symptoms, such as:  Feeling fatigued.  Feeling nervous about going to sleep.  Not feeling rested in the morning.  Having trouble concentrating.  Feeling irritable, anxious, or depressed. TREATMENT  Treatment for insomnia depends on the cause. If your insomnia is caused by an underlying condition, treatment will focus on addressing the condition. Treatment may also include:   Medicines to help you sleep.  Counseling or therapy.  Lifestyle adjustments. HOME  CARE INSTRUCTIONS   Take medicines only as directed by your health care provider.  Keep regular sleeping and waking hours. Avoid naps.  Keep a sleep diary to help you and your health care provider figure out what could be causing your insomnia. Include:   When you sleep.  When you wake up during the night.  How well you sleep.   How rested you feel the next day.  Any side effects of medicines you are taking.  What you eat and drink.   Make your bedroom a comfortable place where it is easy to fall asleep:  Put up shades or special blackout curtains to block light from outside.  Use a white noise machine to block noise.  Keep the temperature cool.   Exercise regularly as directed by your health care provider. Avoid exercising right before bedtime.  Use relaxation techniques to manage stress. Ask your health care provider to suggest some techniques that may work well for you. These may include:  Breathing exercises.  Routines to release muscle tension.  Visualizing peaceful scenes.  Cut back on alcohol, caffeinated beverages, and cigarettes, especially close to bedtime. These can disrupt your sleep.  Do not overeat or eat spicy foods right before bedtime. This can lead to digestive discomfort that can make it hard for you to sleep.  Limit screen use before bedtime. This includes:  Watching TV.  Using your smartphone, tablet, and computer.  Stick to a routine. This can help you fall asleep faster. Try to do a quiet activity, brush your teeth, and go to bed at the same time each night.  Get out of bed if you are still awake after 15 minutes of trying to sleep. Keep the lights down, but try reading or doing a quiet activity. When you feel sleepy, go back to bed.  Make sure that you drive carefully. Avoid driving if you feel very  sleepy.  Keep all follow-up appointments as directed by your health care provider. This is important. SEEK MEDICAL CARE IF:   You are  tired throughout the day or have trouble in your daily routine due to sleepiness.  You continue to have sleep problems or your sleep problems get worse. SEEK IMMEDIATE MEDICAL CARE IF:   You have serious thoughts about hurting yourself or someone else.   This information is not intended to replace advice given to you by your health care provider. Make sure you discuss any questions you have with your health care provider.   Document Released: 04/17/2000 Document Revised: 01/09/2015 Document Reviewed: 01/19/2014 Elsevier Interactive Patient Education 2016 Elsevier Inc[employer]   Cellulitis Cellulitis is an infection of the skin and the tissue beneath it. The infected area is usually red and tender. Cellulitis occurs most often in the arms and lower legs.  CAUSES  Cellulitis is caused by bacteria that enter the skin through cracks or cuts in the skin. The most common types of bacteria that cause cellulitis are staphylococci and streptococci. SIGNS AND SYMPTOMS   Redness and warmth.  Swelling.  Tenderness or pain.  Fever. DIAGNOSIS  Your health care provider can usually determine what is wrong based on a physical exam. Blood tests may also be done. TREATMENT  Treatment usually involves taking an antibiotic medicine. HOME CARE INSTRUCTIONS   Take your antibiotic medicine as directed by your health care provider. Finish the antibiotic even if you start to feel better.  Keep the infected arm or leg elevated to reduce swelling.  Apply a warm cloth to the affected area up to 4 times per day to relieve pain.  Take medicines only as directed by your health care provider.  Keep all follow-up visits as directed by your health care provider. SEEK MEDICAL CARE IF:   You notice red streaks coming from the infected area.  Your red area gets larger or turns dark in color.  Your bone or joint underneath the infected area becomes painful after the skin has healed.  Your infection returns in  the same area or another area.  You notice a swollen bump in the infected area.  You develop new symptoms.  You have a fever. SEEK IMMEDIATE MEDICAL CARE IF:   You feel very sleepy.  You develop vomiting or diarrhea.  You have a general ill feeling (malaise) with muscle aches and pains.   This information is not intended to replace advice given to you by your health care provider. Make sure you discuss any questions you have with your health care provider.   Document Released: 01/28/2005 Document Revised: 01/09/2015 Document Reviewed: 07/06/2011 Elsevier Interactive Patient Education 2016 [place] K. Lovell Roe M.D.

## 2015-07-10 NOTE — Telephone Encounter (Signed)
Patient Name: Henry Woods  DOB: Jul 15, 1970    Initial Comment Caller states thinks he was bit by something over the weekend, going across his forehead and down to eye, swelling getting worse, burning sensation   Nurse Assessment  Nurse: Mallie Mussel, RN, Alveta Heimlich Date/Time (Eastern Time): 07/10/2015 10:54:13 AM  Confirm and document reason for call. If symptomatic, describe symptoms. You must click the next button to save text entered. ---Caller states that he thinks he was bitten by something on Saturday. He woke up on Sunday with some swelling of his forehead and making its way to his right eye and around to his temple. He has a burning sensation that he rates his pain as 2 The swelling skin is turning red in color, a pretty good sized area. Denies fever. There are 2 red spots but denies drainage.  Has the patient traveled out of the country within the last 30 days? ---No  Does the patient have any new or worsening symptoms? ---Yes  Will a triage be completed? ---Yes  Related visit to physician within the last 2 weeks? ---No  Does the PT have any chronic conditions? (i.e. diabetes, asthma, etc.) ---No  Is this a behavioral health or substance abuse call? ---No     Guidelines    Guideline Title Affirmed Question Affirmed Notes  Insect Bite [1] Red or very tender (to touch) area AND [2] started over 24 hours after the bite    Final Disposition User   See Physician within [place], RN, Alveta Heimlich    Comments  Appointment scheduled with Dr. Shanon Ace for tomorrow at 9:45am.   Referrals  REFERRED TO PCP OFFICE   Disagree/Comply: Comply

## 2015-07-10 NOTE — Telephone Encounter (Signed)
Left message on machine for patient for more details about his rash.

## 2015-07-11 ENCOUNTER — Ambulatory Visit (INDEPENDENT_AMBULATORY_CARE_PROVIDER_SITE_OTHER): Payer: No Typology Code available for payment source | Admitting: Internal Medicine

## 2015-07-11 ENCOUNTER — Encounter: Payer: Self-pay | Admitting: Internal Medicine

## 2015-07-11 VITALS — BP 110/70 | Temp 98.4°F | Wt 176.8 lb

## 2015-07-11 DIAGNOSIS — G4700 Insomnia, unspecified: Secondary | ICD-10-CM | POA: Diagnosis not present

## 2015-07-11 DIAGNOSIS — L03211 Cellulitis of face: Secondary | ICD-10-CM | POA: Diagnosis not present

## 2015-07-11 MED ORDER — DOXYCYCLINE HYCLATE 100 MG PO TABS: 100.0000 mg | ORAL_TABLET | Freq: Two times a day (BID) | ORAL | Status: DC

## 2015-07-11 MED ORDER — CEFTRIAXONE SODIUM 1 G IJ SOLR
1.0000 g | Freq: Once | INTRAMUSCULAR | Status: AC
  Administered 2015-07-11: 1 g via INTRAMUSCULAR

## 2015-07-11 NOTE — Patient Instructions (Addendum)
Injection of antibiotic today   Increase the keflex to every 6 hours  Add additional antibiotic   Doxycycline  For 10 days .  Can rechek in Saturday am clinic call ahead  Or next week a t end of medication  ( other orivider)  Need to plan for fu liver tests routine  After better .  Attend to sleep hygiene   In the future  Sleep aids are a short term help with  Sleep plan    Dark at night avoid alcohol caffiene   4-6 hours before bed avoid back lighting   Can reset the clock     Insomnia Insomnia is a sleep disorder that makes it difficult to fall asleep or to stay asleep. Insomnia can cause tiredness (fatigue), low energy, difficulty concentrating, mood swings, and poor performance at work or school.  There are three different ways to classify insomnia:  Difficulty falling asleep.  Difficulty staying asleep.  Waking up too early in the morning. Any type of insomnia can be long-term (chronic) or short-term (acute). Both are common. Short-term insomnia usually lasts for three months or less. Chronic insomnia occurs at least three times a week for longer than three months. CAUSES  Insomnia may be caused by another condition, situation, or substance, such as:  Anxiety.  Certain medicines.  Gastroesophageal reflux disease (GERD) or other gastrointestinal conditions.  Asthma or other breathing conditions.  Restless legs syndrome, sleep apnea, or other sleep disorders.  Chronic pain.  Menopause. This may include hot flashes.  Stroke.  Abuse of alcohol, tobacco, or illegal drugs.  Depression.  Caffeine.   Neurological disorders, such as Alzheimer disease.  An overactive thyroid (hyperthyroidism). The cause of insomnia may not be known. RISK FACTORS Risk factors for insomnia include:  Gender. Women are more commonly affected than men.  Age. Insomnia is more common as you get older.  Stress. This may involve your professional or personal life.  Income. Insomnia  is more common in people with lower income.  Lack of exercise.   Irregular work schedule or night shifts.  Traveling between different time zones. SIGNS AND SYMPTOMS If you have insomnia, trouble falling asleep or trouble staying asleep is the main symptom. This may lead to other symptoms, such as:  Feeling fatigued.  Feeling nervous about going to sleep.  Not feeling rested in the morning.  Having trouble concentrating.  Feeling irritable, anxious, or depressed. TREATMENT  Treatment for insomnia depends on the cause. If your insomnia is caused by an underlying condition, treatment will focus on addressing the condition. Treatment may also include:   Medicines to help you sleep.  Counseling or therapy.  Lifestyle adjustments. HOME CARE INSTRUCTIONS   Take medicines only as directed by your health care provider.  Keep regular sleeping and waking hours. Avoid naps.  Keep a sleep diary to help you and your health care provider figure out what could be causing your insomnia. Include:   When you sleep.  When you wake up during the night.  How well you sleep.   How rested you feel the next day.  Any side effects of medicines you are taking.  What you eat and drink.   Make your bedroom a comfortable place where it is easy to fall asleep:  Put up shades or special blackout curtains to block light from outside.  Use a white noise machine to block noise.  Keep the temperature cool.   Exercise regularly as directed by your health care provider. Avoid  exercising right before bedtime.  Use relaxation techniques to manage stress. Ask your health care provider to suggest some techniques that may work well for you. These may include:  Breathing exercises.  Routines to release muscle tension.  Visualizing peaceful scenes.  Cut back on alcohol, caffeinated beverages, and cigarettes, especially close to bedtime. These can disrupt your sleep.  Do not overeat or eat  spicy foods right before bedtime. This can lead to digestive discomfort that can make it hard for you to sleep.  Limit screen use before bedtime. This includes:  Watching TV.  Using your smartphone, tablet, and computer.  Stick to a routine. This can help you fall asleep faster. Try to do a quiet activity, brush your teeth, and go to bed at the same time each night.  Get out of bed if you are still awake after 15 minutes of trying to sleep. Keep the lights down, but try reading or doing a quiet activity. When you feel sleepy, go back to bed.  Make sure that you drive carefully. Avoid driving if you feel very sleepy.  Keep all follow-up appointments as directed by your health care provider. This is important. SEEK MEDICAL CARE IF:   You are tired throughout the day or have trouble in your daily routine due to sleepiness.  You continue to have sleep problems or your sleep problems get worse. SEEK IMMEDIATE MEDICAL CARE IF:   You have serious thoughts about hurting yourself or someone else.   This information is not intended to replace advice given to you by your health care provider. Make sure you discuss any questions you have with your health care provider.   Document Released: 04/17/2000 Document Revised: 01/09/2015 Document Reviewed: 01/19/2014 Elsevier Interactive Patient Education 2016 [employer]vier Inc.   Cellulitis Cellulitis is an infection of the skin and the tissue beneath it. The infected area is usually red and tender. Cellulitis occurs most often in the arms and lower legs.  CAUSES  Cellulitis is caused by bacteria that enter the skin through cracks or cuts in the skin. The most common types of bacteria that cause cellulitis are staphylococci and streptococci. SIGNS AND SYMPTOMS   Redness and warmth.  Swelling.  Tenderness or pain.  Fever. DIAGNOSIS  Your health care provider can usually determine what is wrong based on a physical exam. Blood tests may also be  done. TREATMENT  Treatment usually involves taking an antibiotic medicine. HOME CARE INSTRUCTIONS   Take your antibiotic medicine as directed by your health care provider. Finish the antibiotic even if you start to feel better.  Keep the infected arm or leg elevated to reduce swelling.  Apply a warm cloth to the affected area up to 4 times per day to relieve pain.  Take medicines only as directed by your health care provider.  Keep all follow-up visits as directed by your health care provider. SEEK MEDICAL CARE IF:   You notice red streaks coming from the infected area.  Your red area gets larger or turns dark in color.  Your bone or joint underneath the infected area becomes painful after the skin has healed.  Your infection returns in the same area or another area.  You notice a swollen bump in the infected area.  You develop new symptoms.  You have a fever. SEEK IMMEDIATE MEDICAL CARE IF:   You feel very sleepy.  You develop vomiting or diarrhea.  You have a general ill feeling (malaise) with muscle aches and pains.  This information is not intended to replace advice given to you by your health care provider. Make sure you discuss any questions you have with your health care provider.   Document Released: 01/28/2005 Document Revised: 01/09/2015 Document Reviewed: 07/06/2011 Elsevier Interactive Patient Education [employer].

## 2015-07-15 ENCOUNTER — Ambulatory Visit (INDEPENDENT_AMBULATORY_CARE_PROVIDER_SITE_OTHER): Payer: No Typology Code available for payment source | Admitting: Family Medicine

## 2015-07-15 ENCOUNTER — Encounter: Payer: Self-pay | Admitting: Family Medicine

## 2015-07-15 VITALS — BP 106/62 | HR 60 | Temp 98.7°F | Ht 67.0 in | Wt 180.0 lb

## 2015-07-15 DIAGNOSIS — L03211 Cellulitis of face: Secondary | ICD-10-CM | POA: Diagnosis not present

## 2015-07-15 MED ORDER — LEVOFLOXACIN 500 MG PO TABS: 500.0000 mg | ORAL_TABLET | Freq: Every day | ORAL | Status: AC

## 2015-07-15 NOTE — Progress Notes (Signed)
Pre visit review using our clinic review tool, if applicable. No additional management support is needed unless otherwise documented below in the visit note. 

## 2015-07-15 NOTE — Progress Notes (Signed)
   Subjective:    Patient ID: Henry Woods, male    DOB: 1971/03/21, 45 y.o.   MRN: [id number]  HPI Here to follow up a cellulitis on the face. He saw Dr. Regis Bill last week for swelling and redness and tenderness over the right eye on the forehead. He was given a shot of Rocephin and was started on Keflex and Doxycycline. He says he feels no better today because he is still swollen both above and below the right eye. No fever. Eating and drinking normally. No vision problems. He is also applying Mupiricin ointment to the area.    Review of Systems  Constitutional: Negative.   HENT: Positive for facial swelling. Negative for ear pain.   Eyes: Negative.   Respiratory: Negative.        Objective:   Physical Exam  Constitutional: He appears well-developed and well-nourished. No distress.  HENT:  Right Ear: External ear normal.  Left Ear: External ear normal.  Nose: Nose normal.  Mouth/Throat: Oropharynx is clear and moist.  I was able to examine him last week with Dr. Regis Bill, and he definitely looks better today. The erythem is fading away and the tenderness and warmth are gone. He is less swollen above the right eye now but a little more swollen below the eye  Eyes: Conjunctivae and EOM are normal. Pupils are equal, round, and reactive to light.  Neck: Neck supple.  Lymphadenopathy:    He has no cervical adenopathy.          Assessment & Plan:  Cellulitis, he seems to have improved a bit. We will keep him on Dxycycline, but we will switch from Keflex to Levaquin 500 mg daily. Recheck in one week

## 2017-02-03 IMAGING — CT CT ABD-PELV W/ CM
1 of 3 series · 14 of 32 positions shown, 19 images · IV contrast (omnipaque)
Comparison: None.

CLINICAL DATA: Left upper quadrant pain neck pain chest pain with
vomiting, elevated LFTs, history of gastroesophageal reflux disease

EXAM:
CT ABDOMEN AND PELVIS WITH CONTRAST
TECHNIQUE: Multidetector CT imaging of the abdomen and pelvis was performed
using the standard protocol following bolus administration of
intravenous contrast.
CONTRAST:  100mL OMNIPAQUE IOHEXOL 300 MG/ML  SOLN

[Series 2: routine abd pel with · axial · 0.69mm/px · z∈[-479,-44]mm · 14 of 99 slices shown, 19 images]
[im 6/99  soft-tissue]
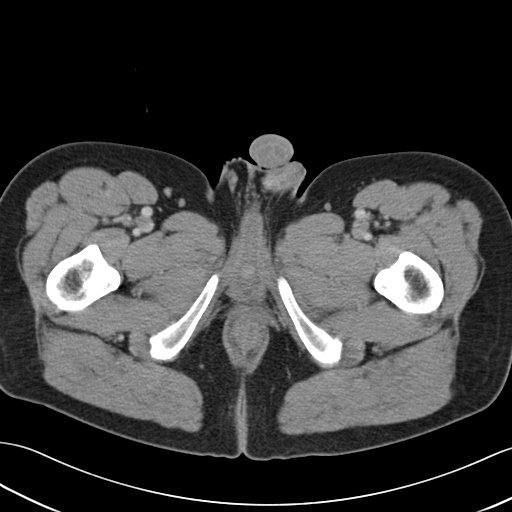
[im 6/99  bone]
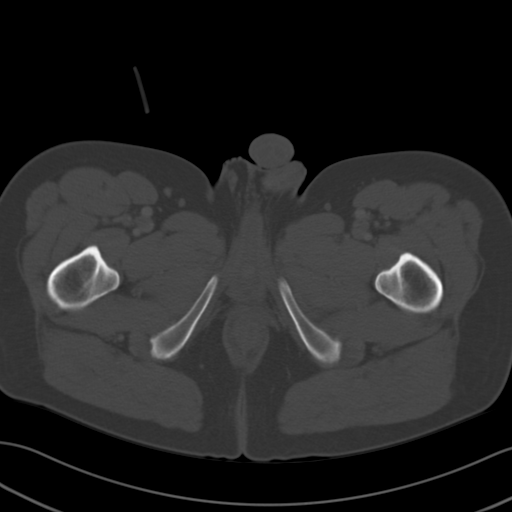
[im 16/99  soft-tissue]
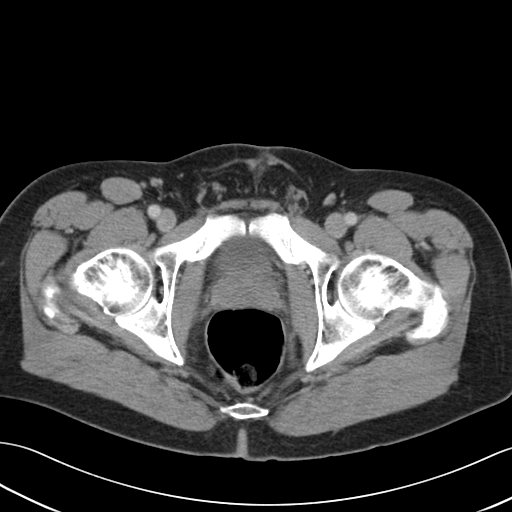
[im 21/99  soft-tissue]
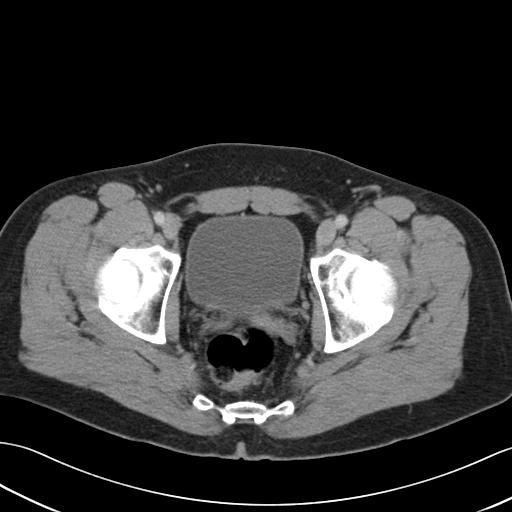
[im 26/99  soft-tissue]
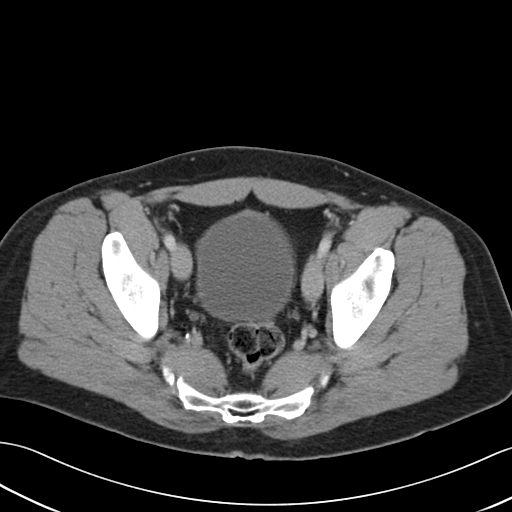
[im 37/99  soft-tissue]
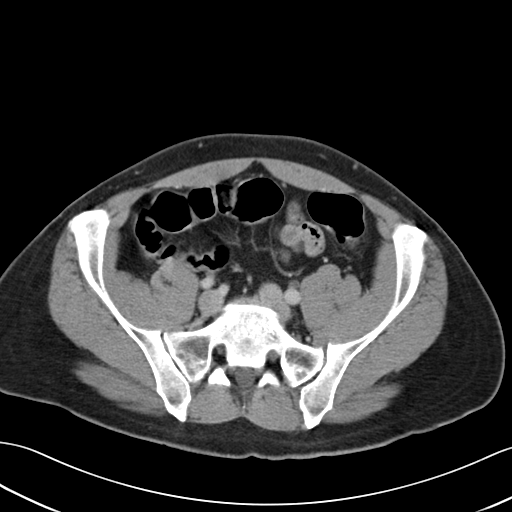
[im 42/99  soft-tissue]
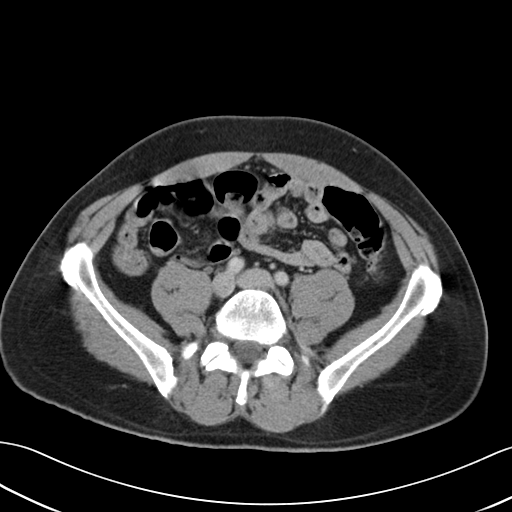
[im 52/99  soft-tissue]
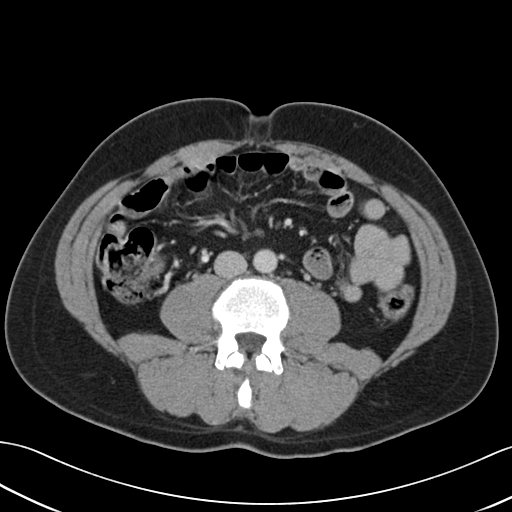
[im 57/99  soft-tissue]
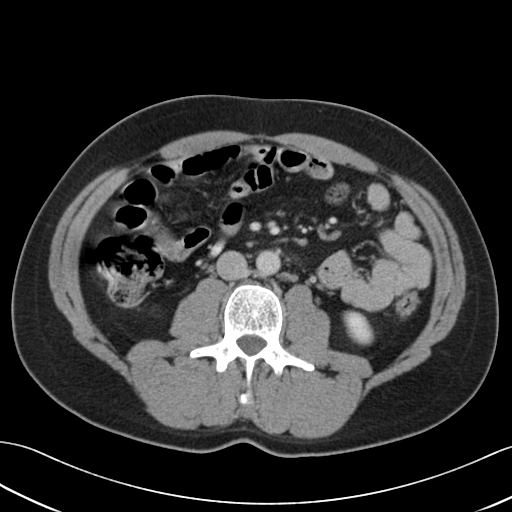
[im 62/99  soft-tissue]
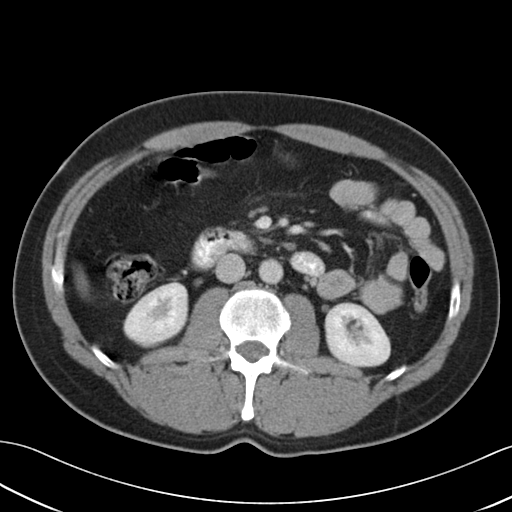
[im 62/99  bone]
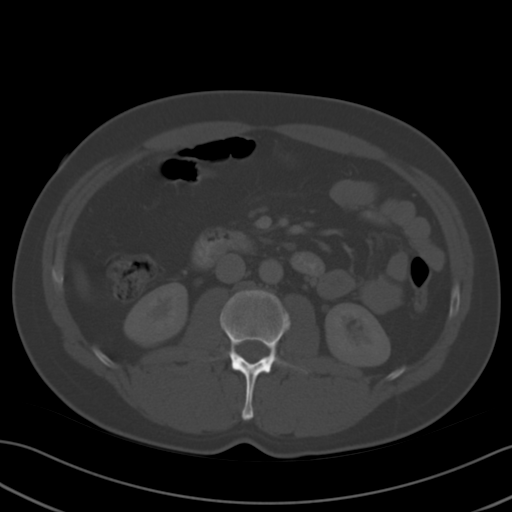
[im 73/99  soft-tissue]
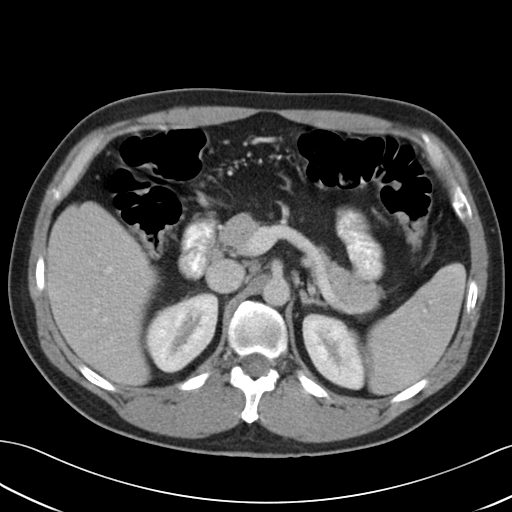
[im 78/99  soft-tissue]
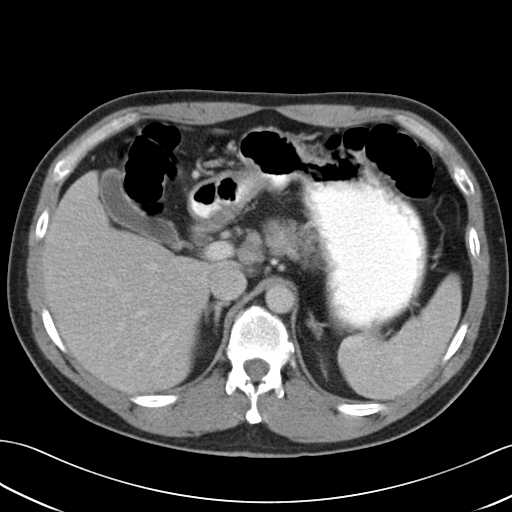
[im 78/99  lung]
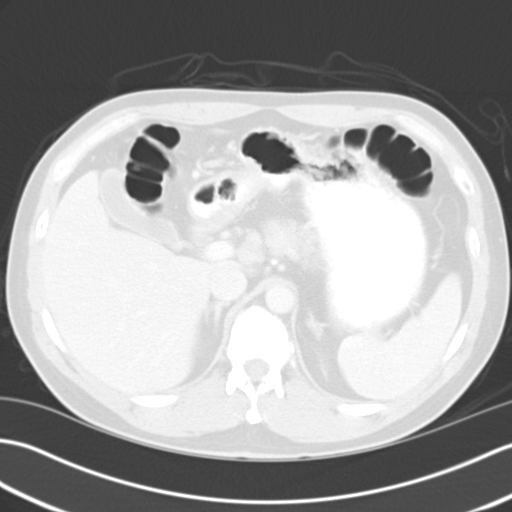
[im 83/99  soft-tissue]
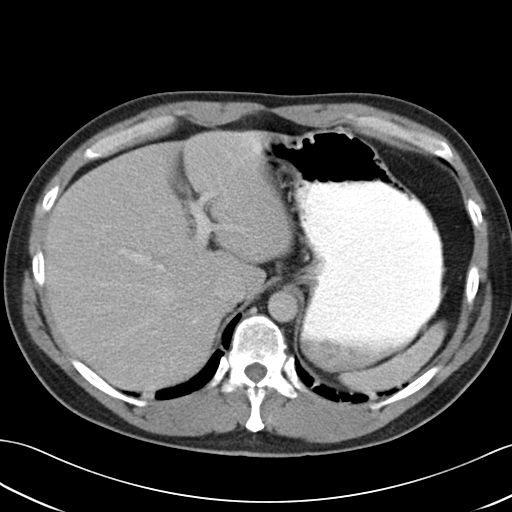
[im 83/99  lung]
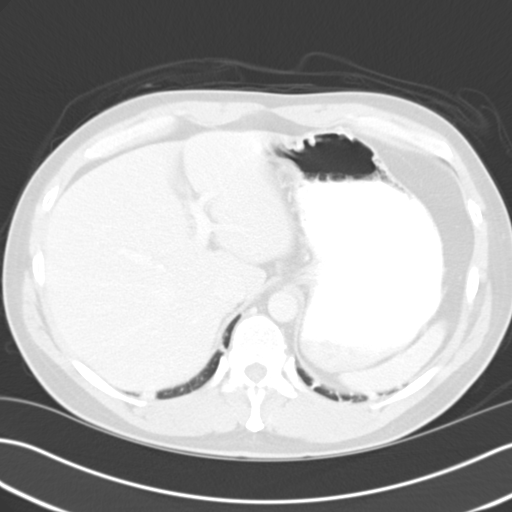
[im 88/99  lung]
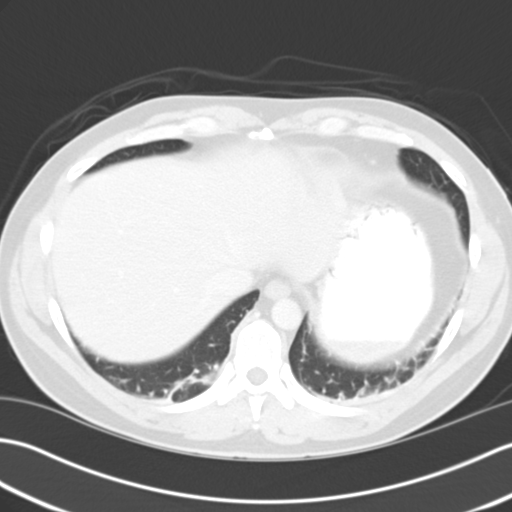
[im 93/99  soft-tissue]
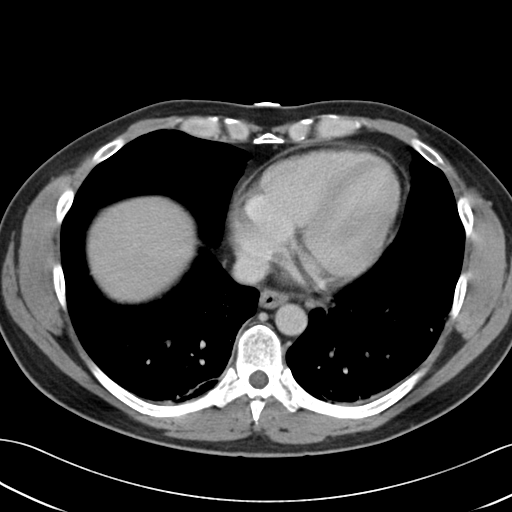
[im 93/99  lung]
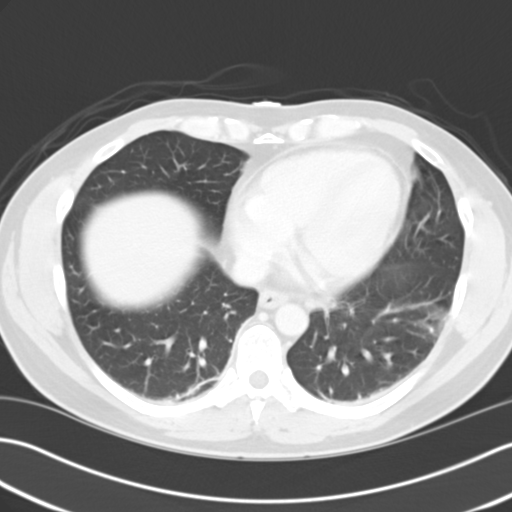

[14 of 32 positions shown; findings below may reference images not displayed]

FINDINGS: Lower chest: Mild subsegmental atelectasis at both lung bases.
Minimal pericardial thickening or fluid. Mild thickening of the
distal esophagus.

Hepatobiliary: No masses or other significant abnormality.

Pancreas: No mass, inflammatory changes, or other significant
abnormality.

Spleen: Within normal limits in size and appearance.

Adrenals/Urinary Tract: No masses identified. No evidence of
hydronephrosis.

Stomach/Bowel: Normal including appendix

Vascular/Lymphatic: Enlarged celiac axis lymph nodes the largest
measuring 11 mm. Mildly prominent mesenteric lymph nodes measuring
up to 7 mm with mild increased attenuation involving the small bowel
mesentery centrally.

Reproductive: No mass or other significant abnormality.

Other: None.

Musculoskeletal:  No suspicious bone lesions identified.
IMPRESSION: No specific abnormalities. There are enlarged abdominal lymph nodes
and mildly increased mesenteric attenuation suggesting the
possibility of mesenteric adenitis.

## 2017-02-13 IMAGING — US US ABDOMEN LIMITED
1 series · 14 of 25 positions shown · non-contrast
Comparison: None.

CLINICAL DATA: 44-year-old male with nausea and vomiting for 1 day.
Abnormal LFTs. Initial encounter.

EXAM:
US ABDOMEN LIMITED - RIGHT UPPER QUADRANT

[Series 1: us abdomen limited · 0.20mm/px · 14 of 71 slices shown]
[im 1/71]
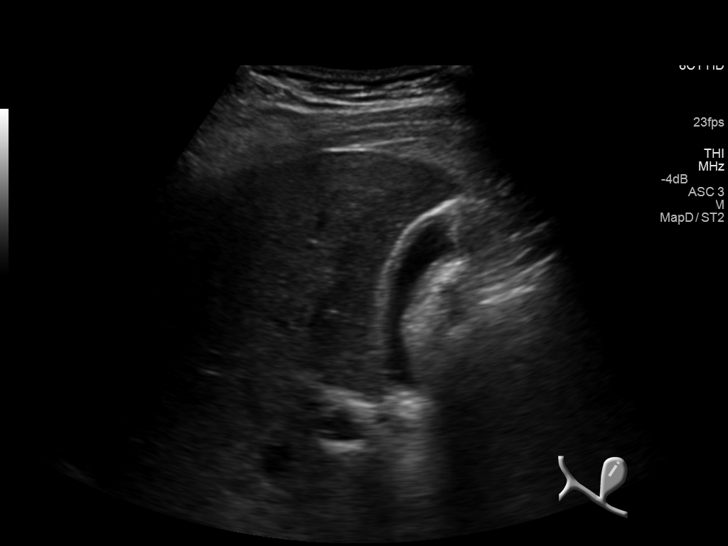
[im 6/71]
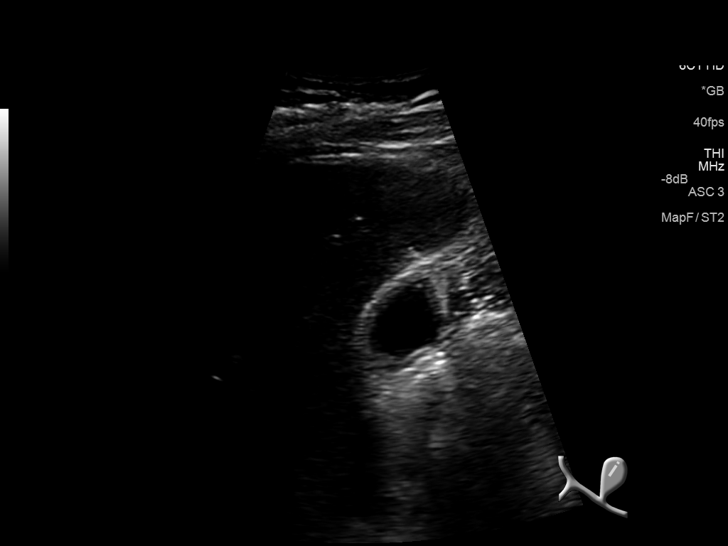
[im 12/71]
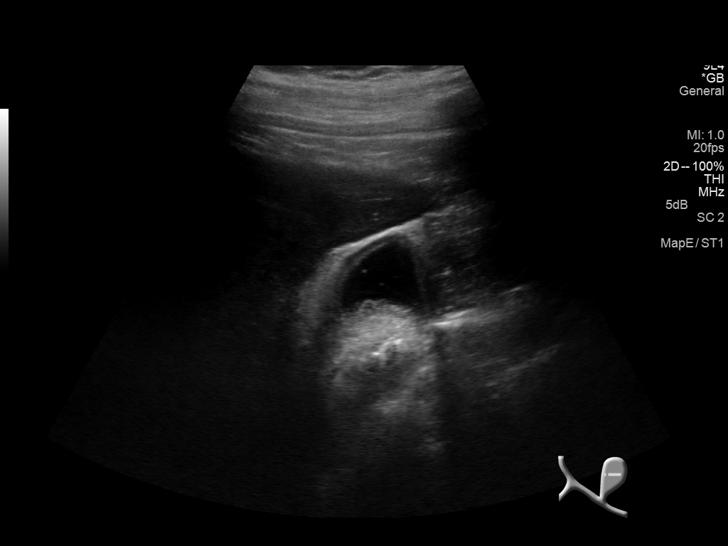
[im 18/71]
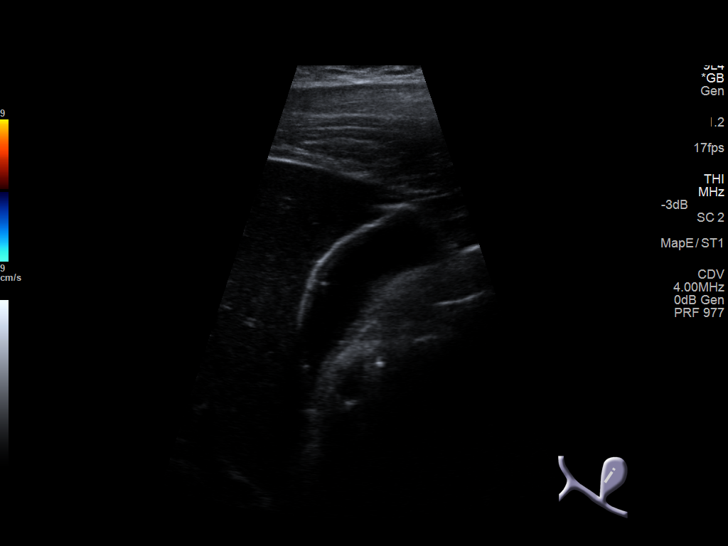
[im 24/71]
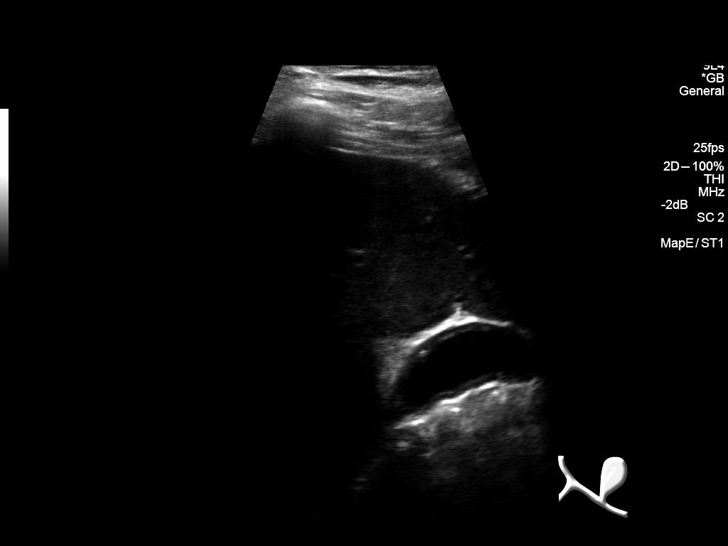
[im 27/71]
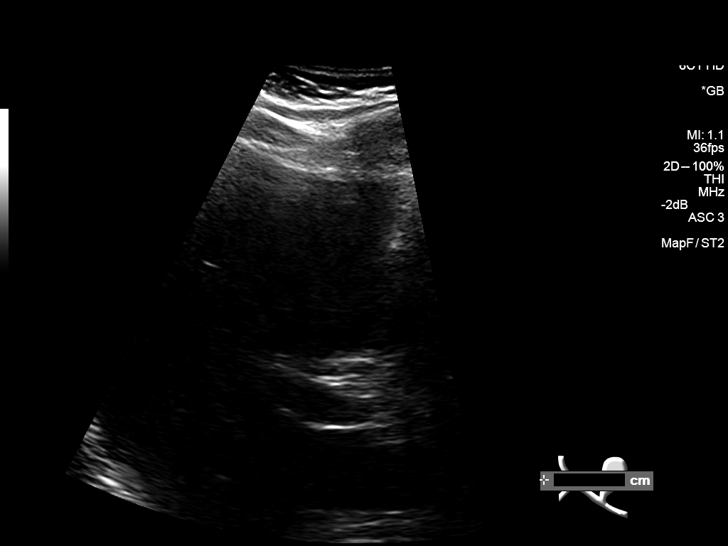
[im 33/71]
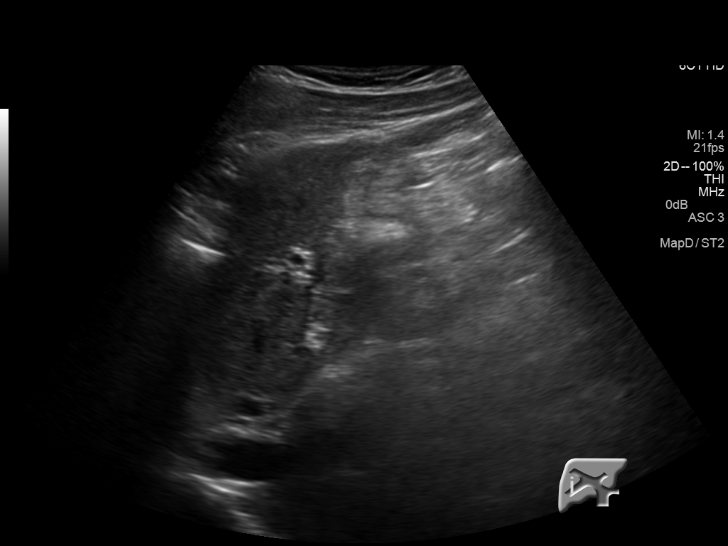
[im 38/71]
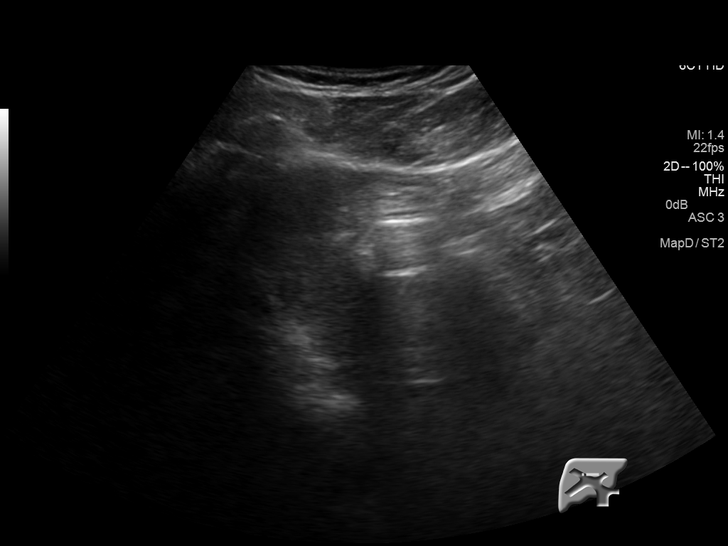
[im 44/71]
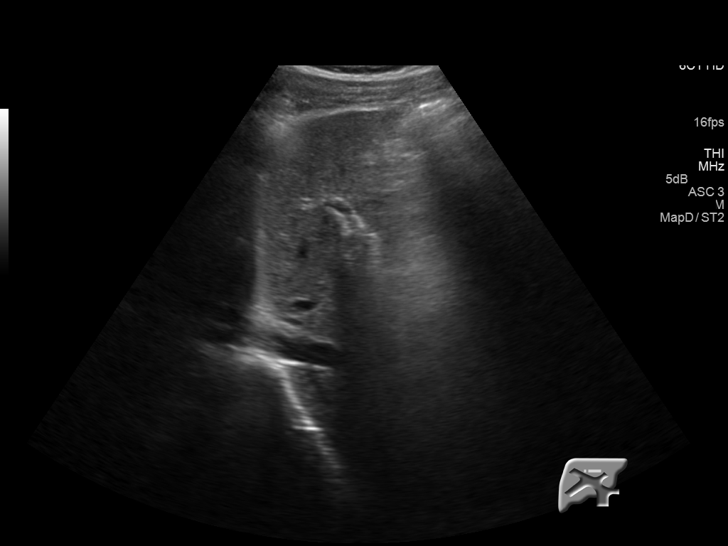
[im 47/71]
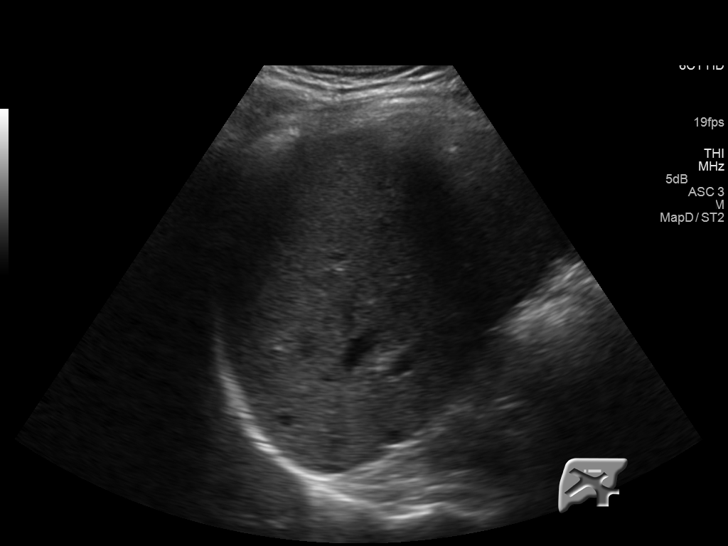
[im 53/71]
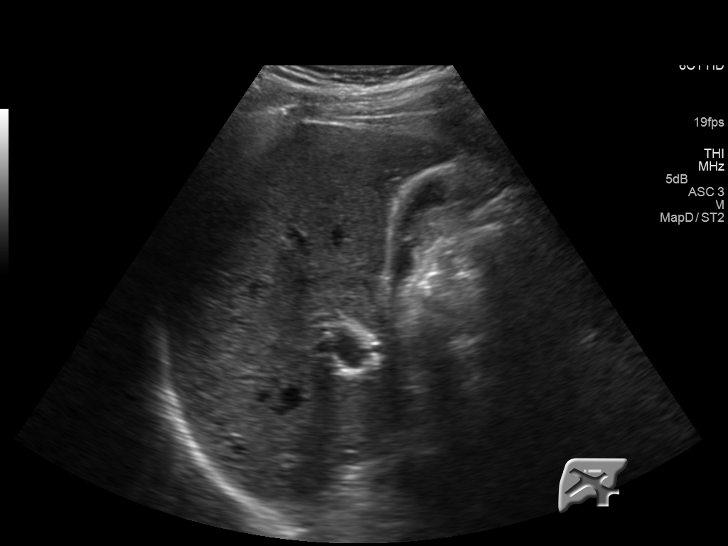
[im 59/71]
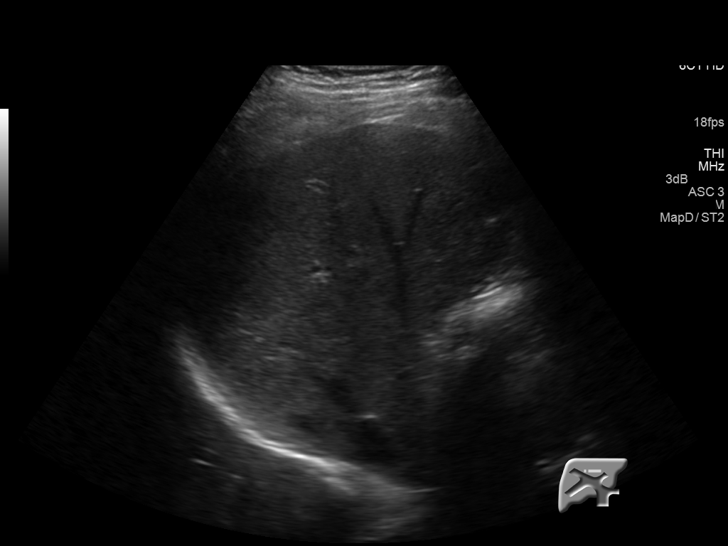
[im 65/71]
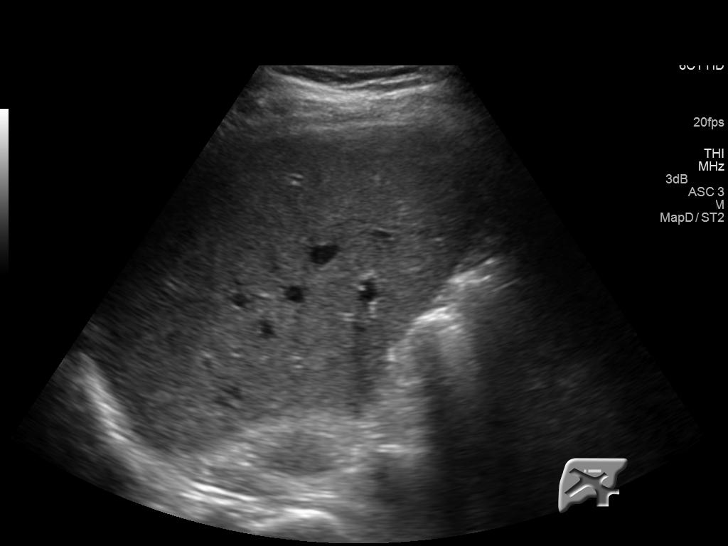
[im 71/71]
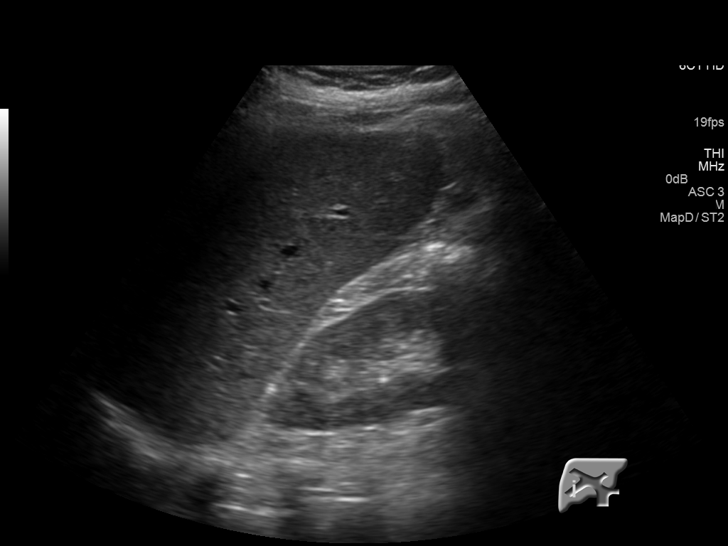

[14 of 25 positions shown; findings below may reference images not displayed]

FINDINGS: Gallbladder:

Partially contracted. Gallbladder wall thickness remains within
normal limits at 2-3 mm. No shadowing stones or echogenic dependent
sludge. There is a 2 mm probable gallbladder polyp noted (image 19),
inconsequential. No pericholecystic fluid. No sonographic Murphy
sign elicited.

Common bile duct:

Diameter: 4 mm, normal

Liver:

No focal lesion identified. Within normal limits in parenchymal
echogenicity. No intrahepatic biliary ductal dilatation or discrete
liver lesion.

Other findings: Negative visible right kidney.
IMPRESSION: Negative gallbladder (2 mm polyp is inconsequential and does not
require follow-up). Negative right upper quadrant ultrasound.

## 2017-06-24 NOTE — Progress Notes (Signed)
Chief Complaint  Patient presents with  . Annual Exam    Pt states that he thinks he had the flu - was having fever about 1 week ago with body aches, cough, sore throat. Pt still has residual cough. Pt was not treated.     HPI: Patient  [place]  47 y.o. comes in today for [facility] visit    May have had flu last week still has some cough and now cold sweats at times but no cp   Sob or fever .  No combustible tobacco but vaping on lowest dose .   Sleep and anxiety  An issues runs in family   watching on line news has been anxiety producing for him.  Mom hx of anxiety ? Some meds   psoriatic saw derm about a year ago and topical worked minimally hard to keep on kenss so not using now  Has area of knees and shins   Health Maintenance  Topic Date Due  . HIV Screening  08/26/1984  . INFLUENZA VACCINE  02/25/2018 (Originally 12/02/2016)  . TETANUS/TDAP  03/07/2022   Health Maintenance Review LIFESTYLE:  Exercise:     Not that active .   No new exercise  Tobacco/ETS:  No  vapes    Alcohol:  Not much    .  kratum .   regulary  ? Try to get off    Lots of anxiety .    Baseline  .   Sugar beverages:  Panic attacks.  Sleep:  4  Hours  Drug use: no HH of  3  Pets 2 dogs  Work:   About 30 hours      .       ROS:  GEN/ HEENT: No fever, significant weight changes sweats headaches vision problems hearing changes, CV/ PULM; No chest pain shortness of breath cough, syncope,edema  change in exercise tolerance. GI /GU: No adominal pain, vomiting, change in bowel habits. No blood in the stool. No significant GU symptoms. SKIN/HEME: ,no suspicious lesions or bleeding. No lymphadenopathy, nodules, masses.  NEURO/ PSYCH:  No neurologic signs such as weakness numbness. No depression anxiety. IMM/ Allergy: No unusual infections.  Allergy .   REST of 12 system review negative except as per HPI   Past Medical History:  Diagnosis Date  . Arthritis   . Chicken pox   . GERD  (gastroesophageal reflux disease)   . Hyperlipidemia     Past Surgical History:  Procedure Laterality Date  . HAND SURGERY Left 1981    Family History  Problem Relation Age of Onset  . Arthritis Mother   . Hyperlipidemia Mother   . Hyperlipidemia Father   . Colon cancer Maternal Grandmother   . Colon polyps Mother   . Colon polyps Maternal Grandmother   . Colon polyps Maternal Uncle   . Prostate cancer Maternal Uncle   . Diabetes Paternal Uncle   . Diabetes Paternal Aunt   . Diabetes Paternal Grandmother   . Prostate cancer Paternal Grandfather     Social History   Socioeconomic History  . Marital status: Single    Spouse name: None  . Number of children: None  . Years of education: None  . Highest education level: None  Social Needs  . Financial resource strain: None  . Food insecurity - worry: None  . Food insecurity - inability: None  . Transportation needs - medical: None  . Transportation needs - non-medical: None  Occupational History  .  None  Tobacco Use  . Smoking status: Former Smoker    Packs/day: 0.50    Types: Cigarettes  . Smokeless tobacco: Never Used  . Tobacco comment: vapor  Substance and Sexual Activity  . Alcohol use: Yes    Alcohol/week: 0.0 oz    Comment: occ  . Drug use: No  . Sexual activity: None  Other Topics Concern  . 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 UNABLE TO FIND Med Name: Kratom ( for pain) 1 powder packet BID and sometimes extra in evening.    Marland Kitchen aspirin 325 MG tablet Take 325 mg by mouth daily as needed.    . doxycycline (VIBRA-TABS) 100 MG tablet Take 1 tablet (100 mg total) by mouth 2 (two) times daily. (Patient not taking: Reported on 06/25/2017) 20 tablet 0  . mupirocin ointment (BACTROBAN) 2 % Apply to affected area three times daily     No facility-administered medications prior to visit.      EXAM:  BP 108/60 (BP Location: Right Arm, Patient Position: Sitting, Cuff Size: Normal)   Pulse 72   Temp 98.4 F (36.9 C) (Oral)   Wt 165 lb 14.4 oz (75.3 kg)   BMI 25.98 kg/m   Body mass index is 25.98 kg/m. Wt Readings from Last 3 Encounters:  06/25/17 165 lb 14.4 oz (75.3 kg)  07/15/15 180 lb (81.6 kg)  07/11/15 176 lb 12.8 oz (80.2 kg)    Physical Exam: Vital signs reviewed DPO:EUMP is a well-developed well-nourished alert cooperative    who appearsr stated age in no acute distress.  HEENT: normocephalic atraumatic , Eyes: PERRL EOM's full, conjunctiva clear, Nares: paten,t no deformity discharge or tenderness., Ears: no deformity EAC's clear TMs with normal landmarks. Mouth: clear OP, no lesions, edema.  Moist mucous membranes. Dentition in adequate repair. NECK: supple without masses, thyromegaly or bruits. CHEST/PULM:  Clear to auscultation and percussion breath sounds equal no wheeze , rales or rhonchi. No chest wall deformities or tenderness. Breast: normal by inspection . No dimpling, discharge, masses, tenderness or discharge . CV: PMI is nondisplaced, S1 S2 no gallops, murmurs, rubs. Peripheral pulses are full without delay.No JVD .  ABDOMEN: Bowel sounds normal nontender  No guard or rebound, no hepato splenomegal no CVA tenderness.  No hernia. Extremtities:  No clubbing cyanosis or edema, no acute joint swelling or redness no focal atrophy NEURO:  Oriented x3, cranial nerves 3-12 appear to be  intact, no obvious focal weakness,gait within normal limits no abnormal reflexes or asymmetrical SKIN: No acute rashes normal turgor, color, no bruising or petechiae. Large  Knee plaques  And one  Patch righ shin pros from  Trauma no  Dc or cellulitis  PSYCH: Oriented, good eye contact, no obvious depression anxiety, cognition and judgment appear normal. LN: no cervical axillary inguinal adenopathy  Lab Results  Component Value Date   WBC 10.0 02/11/2015   HGB 15.7 02/11/2015   HCT 46.8 02/11/2015   PLT 264 02/11/2015   GLUCOSE 92 03/07/2015   CHOL 238 (H) 08/24/2012   TRIG 159.0 (H) 08/24/2012   HDL 51.70 08/24/2012   LDLDIRECT 168.3 08/24/2012   ALT 23 03/07/2015   AST 18 03/07/2015   NA 140 03/07/2015   K 4.0 03/07/2015   CL 103 03/07/2015   CREATININE 1.25 03/07/2015   BUN 32 (H) 03/07/2015   CO2 31 03/07/2015   TSH 0.70 02/12/2015   INR 0.94 02/11/2015   HGBA1C 6.0 08/26/2012    BP Readings from Last 3 Encounters:  06/25/17 108/60  07/15/15 106/62  07/11/15 110/70    Lab order es reviewed with pt   ASSESSMENT AND PLAN:  Discussed the following assessment and plan:  Visit for preventive health examination - Plan: Basic metabolic panel, CBC with Differential/Platelet, Hepatic function panel, Lipid panel, TSH, Sedimentation rate  Respiratory tract congestion with cough - Plan: Basic metabolic panel, CBC with Differential/Platelet, Hepatic function panel, Lipid panel, TSH, Sedimentation rate  Anxiety - advise counselign and consdier med  or option sif stragtegies we discussed done work  - Plan: Medical laboratory scientific officer, CBC with Differential/Platelet, Hepatic function panel, Lipid panel, TSH, Sedimentation rate  Screening for HIV (human immunodeficiency virus) - Plan: HIV antibody  Psoriasis-eczema overlap condition  Insomnia, unspecified type - poss from anxiety and other  Disc    Expectant management. About anxiety advise look into counseling and we can consider  med if appropriate .   Also  avoid supplements in case adding to problems  ( says he took silver supplement for flu?)   And using dratum which can have many effects  And contamination Patient Care Team: Jenice Leiner, Standley Brooking, MD as PCP - General (Internal Medicine) Patient Instructions  Stop all supplements    Etc  As reason .   If  Ongoing  cough and    And sweats  Let us know and we can   Reevaluate consider chest xray but you exam is good today .   If   Not controlled  consider counseling and poss medication about the anxiety .       Health Maintenance, Male A healthy lifestyle and preventive care is important for your health and wellness. Ask your health care provider about what schedule of regular examinations is right for you. What should I know about weight and diet? Eat a Healthy Diet  Eat plenty of vegetables, fruits, whole grains, low-fat dairy products, and lean protein.  Do not eat a lot of foods high in solid fats, added sugars, or salt.  Maintain a Healthy Weight Regular exercise can help you achieve or maintain a healthy weight. You should:  Do at least 150 minutes of exercise each week. The exercise should increase your heart rate and make you sweat (moderate-intensity exercise).  Do strength-training exercises at least twice a week.  Watch Your Levels of Cholesterol and Blood Lipids  Have your blood tested for lipids and cholesterol every 5 years starting at 48 years of age. If you are at high risk for heart disease, you should start having your blood tested when you are 48 years old. You may need to have your cholesterol levels checked more often if: ? Your lipid or cholesterol levels  are high. ? You are older than 48 years of age. ? You are at high risk for heart disease.  What should I know about cancer screening? Many types of cancers can be detected early and may often be prevented. Lung Cancer  You should be screened every year for lung cancer if: ? You are  a current smoker who has smoked for at least 30 years. ? You are a former smoker who has quit within the past 15 years.  Talk to your health care provider about your screening options, when you should start screening, and how often you should be screened.  Colorectal Cancer  Routine colorectal cancer screening usually begins at 48 years of age and should be repeated every 5-10 years until you are 48 years old. You may need to be screened more often if early forms of precancerous polyps or small growths are found. Your health care provider may recommend screening at an earlier age if you have risk factors for colon cancer.  Your health care provider may recommend using home test kits to check for hidden blood in the stool.  A small camera at the end of a tube can be used to examine your colon (sigmoidoscopy or colonoscopy). This checks for the earliest forms of colorectal cancer.  Prostate and Testicular Cancer  Depending on your age and overall health, your health care provider may do certain tests to screen for prostate and testicular cancer.  Talk to your health care provider about any symptoms or concerns you have about testicular or prostate cancer.  Skin Cancer  Check your skin from head to toe regularly.  Tell your health care provider about any new moles or changes in moles, especially if: ? There is a change in a mole's size, shape, or color. ? You have a mole that is larger than a pencil eraser.  Always use sunscreen. Apply sunscreen liberally and repeat throughout the day.  Protect yourself by wearing long sleeves, pants, a wide-brimmed hat, and sunglasses when outside.  What should I know about heart disease, diabetes, and high blood pressure?  If you are 52-58 years of age, have your blood pressure checked every 3-5 years. If you are 6 years of age or older, have your blood pressure checked every year. You should have your blood pressure measured twice-once when you are  at a hospital or clinic, and once when you are not at a hospital or clinic. Record the average of the two measurements. To check your blood pressure when you are not at a hospital or clinic, you can use: ? An automated blood pressure machine at a pharmacy. ? A home blood pressure monitor.  Talk to your health care provider about your target blood pressure.  If you are between 70-69 years old, ask your health care provider if you should take aspirin to prevent heart disease.  Have regular diabetes screenings by checking your fasting blood sugar level. ? If you are at a normal weight and have a low risk for diabetes, have this test once every three years after the age of 57. ? If you are overweight and have a high risk for diabetes, consider being tested at a younger age or more often.  A one-time screening for abdominal aortic aneurysm (AAA) by ultrasound is recommended for men aged 70-75 years who are current or former smokers. What should I know about preventing infection? Hepatitis B If you have a higher risk for hepatitis B, you should be screened for  this virus. Talk with your health care provider to find out if you are at risk for hepatitis B infection. Hepatitis C Blood testing is recommended for:  Everyone born from [birth year] through 1966.  Anyone with known risk factors for hepatitis C.  Sexually Transmitted Diseases (STDs)  You should be screened each year for STDs including gonorrhea and chlamydia if: ? You are sexually active and are younger than 48 years of age. ? You are older than 48 years of age and your health care provider tells you that you are at risk for this type of infection. ? Your sexual activity has changed since you were last screened and you are at an increased risk for chlamydia or gonorrhea. Ask your health care provider if you are at risk.  Talk with your health care provider about whether you are at high risk of being infected with HIV. Your health care  provider may recommend a prescription medicine to help prevent HIV infection.  What else can I do?  Schedule regular health, dental, and eye exams.  Stay current with your vaccines (immunizations).  Do not use any tobacco products, such as cigarettes, chewing tobacco, and e-cigarettes. If you need help quitting, ask your health care provider.  Limit alcohol intake to no more than 2 drinks per day. One drink equals 12 ounces of beer, 5 ounces of wine, or 1 ounces of hard liquor.  Do not use street drugs.  Do not share needles.  Ask your health care provider for help if you need support or information about quitting drugs.  Tell your health care provider if you often feel depressed.  Tell your health care provider if you have ever been abused or do not feel safe at home. This information is not intended to replace advice given to you by your health care provider. Make sure you discuss any questions you have with your health care provider. Document Released: 10/17/2007 Document Revised: 12/18/2015 Document Reviewed: 01/22/2015 Elsevier Interactive Patient Education  2018 [place].      Generalized Anxiety Disorder, Adult Generalized anxiety disorder (GAD) is a mental health disorder. People with this condition constantly worry about everyday events. Unlike normal anxiety, worry related to GAD is not triggered by a specific event. These worries also do not fade or get better with time. GAD interferes with life functions, including relationships, work, and school. GAD can vary from mild to severe. People with severe GAD can have intense waves of anxiety with physical symptoms (panic attacks). What are the causes? The exact cause of GAD is not known. What increases the risk? This condition is more likely to develop in:  Women.  People who have a family history of anxiety disorders.  People who are very shy.  People who experience very stressful life events, such as the  death of a loved one.  People who have a very stressful family environment.  What are the signs or symptoms? People with GAD often worry excessively about many things in their lives, such as their health and family. They may also be overly concerned about:  Doing well at work.  Being on time.  Natural disasters.  Friendships.  Physical symptoms of GAD include:  Fatigue.  Muscle tension or having muscle twitches.  Trembling or feeling shaky.  Being easily startled.  Feeling like your heart is pounding or racing.  Feeling out of breath or like you cannot take a deep breath.  Having trouble falling asleep or staying asleep.  Sweating.  Nausea, diarrhea, or irritable bowel syndrome (IBS).  Headaches.  Trouble concentrating or remembering facts.  Restlessness.  Irritability.  How is this diagnosed? Your health care provider can diagnose GAD based on your symptoms and medical history. You will also have a physical exam. The health care provider will ask specific questions about your symptoms, including how severe they are, when they started, and if they come and go. Your health care provider may ask you about your use of alcohol or drugs, including prescription medicines. Your health care provider may refer you to a mental health specialist for further evaluation. Your health care provider will do a thorough examination and may perform additional tests to rule out other possible causes of your symptoms. To be diagnosed with GAD, a person must have anxiety that:  Is out of his or her control.  Affects several different aspects of his or her life, such as work and relationships.  Causes distress that makes him or her unable to take part in normal activities.  Includes at least three physical symptoms of GAD, such as restlessness, fatigue, trouble concentrating, irritability, muscle tension, or sleep problems.  Before your health care provider can confirm a diagnosis of  GAD, these symptoms must be present more days than they are not, and they must last for six months or longer. How is this treated? The following therapies are usually used to treat GAD:  Medicine. Antidepressant medicine is usually prescribed for long-term daily control. Antianxiety medicines may be added in severe cases, especially when panic attacks occur.  Talk therapy (psychotherapy). Certain types of talk therapy can be helpful in treating GAD by providing support, education, and guidance. Options include: ? Cognitive behavioral therapy (CBT). People learn coping skills and techniques to ease their anxiety. They learn to identify unrealistic or negative thoughts and behaviors and to replace them with positive ones. ? Acceptance and commitment therapy (ACT). This treatment teaches people how to be mindful as a way to cope with unwanted thoughts and feelings. ? Biofeedback. This process trains you to manage your body's response (physiological response) through breathing techniques and relaxation methods. You will work with a therapist while machines are used to monitor your physical symptoms.  Stress management techniques. These include yoga, meditation, and exercise.  A mental health specialist can help determine which treatment is best for you. Some people see improvement with one type of therapy. However, other people require a combination of therapies. Follow these instructions at home:  Take over-the-counter and prescription medicines only as told by your health care provider.  Try to maintain a normal routine.  Try to anticipate stressful situations and allow extra time to manage them.  Practice any stress management or self-calming techniques as taught by your health care provider.  Do not punish yourself for setbacks or for not making progress.  Try to recognize your accomplishments, even if they are small.  Keep all follow-up visits as told by your health care provider. This  is important. Contact a health care provider if:  Your symptoms do not get better.  Your symptoms get worse.  You have signs of depression, such as: ? A persistently sad, cranky, or irritable mood. ? Loss of enjoyment in activities that used to bring you joy. ? Change in weight or eating. ? Changes in sleeping habits. ? Avoiding friends or family members. ? Loss of energy for normal tasks. ? Feelings of guilt or worthlessness. Get help right away if:  You have serious thoughts about hurting  yourself or others. If you ever feel like you may hurt yourself or others, or have thoughts about taking your own life, get help right away. You can go to your nearest emergency department or call:  Your local emergency services (911 in the [place]  A suicide crisis helpline, such as the [facility] at 4343642125. This is open 24 hours a day.  Summary  Generalized anxiety disorder (GAD) is a mental health disorder that involves worry that is not triggered by a specific event.  People with GAD often worry excessively about many things in their lives, such as their health and family.  GAD may cause physical symptoms such as restlessness, trouble concentrating, sleep problems, frequent sweating, nausea, diarrhea, headaches, and trembling or muscle twitching.  A mental health specialist can help determine which treatment is best for you. Some people see improvement with one type of therapy. However, other people require a combination of therapies. This information is not intended to replace advice given to you by your health care provider. Make sure you discuss any questions you have with your health care provider. Document Released: 08/15/2012 Document Revised: 03/10/2016 Document Reviewed: 03/10/2016 Elsevier Interactive Patient Education  [employer]. Below from Up to date   Kratom-Kratom (Mitragyna speciosa), an herb with opioid and stimulant-like  properties, contains indole alkaloids, principally mitragynine and 7-HO-mitragynine, with mu-opioid receptor agonism [85]. It has been used for self-treatment of opioid withdrawal, with little published evidence of efficacy and increasing numbers of reports of lethal overdose and other adverse effects [85]. Kratom should not be used in patients withdrawing from opioids. Investigation of kratom's efficacy and toxicity is limited to case reports/series [85]. In higher doses of 5 to 15 g, frequent and prolonged ingestion of kratom for pain or recreational use has been associated with respiratory depression, anorexia, weight loss, seizures, depression, psychosis, physiologic tolerance, and withdrawal (similar to that seen with opioids) [86]. Opioid withdrawal symptoms emerge 12 to 24 hours after last use and persist for up to seven days. There are no controlled trials supportive of specific pharmacologic treatment of kratom withdrawal, but a 2019 review suggested treatment similar to withdrawal from other opioids, specifically alpha-2-adrenergic agonists and symptomatic treatment [87]. Kratom use is prohibited in some countries and [place] states. The Food and Drug Administration in the [place] has issued multiple advisories on kratom: on health risks with its use for opioid withdrawal [88], its association with a multi-state Salmonella outbreak in 2018 [89], and on a mandatory recall of kratom-containing products associated with [place] in 2018 [90].      Standley Brooking. Catera Hankins M.D.

## 2017-06-25 ENCOUNTER — Ambulatory Visit (INDEPENDENT_AMBULATORY_CARE_PROVIDER_SITE_OTHER): Payer: PRIVATE HEALTH INSURANCE | Admitting: Internal Medicine

## 2017-06-25 ENCOUNTER — Encounter: Payer: Self-pay | Admitting: Internal Medicine

## 2017-06-25 VITALS — BP 108/60 | HR 72 | Temp 98.4°F | Wt 165.9 lb

## 2017-06-25 DIAGNOSIS — R058 Other specified cough: Secondary | ICD-10-CM

## 2017-06-25 DIAGNOSIS — Z87891 Personal history of nicotine dependence: Secondary | ICD-10-CM

## 2017-06-25 DIAGNOSIS — Z114 Encounter for screening for human immunodeficiency virus [HIV]: Secondary | ICD-10-CM

## 2017-06-25 DIAGNOSIS — G4700 Insomnia, unspecified: Secondary | ICD-10-CM | POA: Diagnosis not present

## 2017-06-25 DIAGNOSIS — L309 Dermatitis, unspecified: Secondary | ICD-10-CM | POA: Diagnosis not present

## 2017-06-25 DIAGNOSIS — Z0000 Encounter for general adult medical examination without abnormal findings: Secondary | ICD-10-CM | POA: Diagnosis not present

## 2017-06-25 DIAGNOSIS — R05 Cough: Secondary | ICD-10-CM

## 2017-06-25 DIAGNOSIS — F419 Anxiety disorder, unspecified: Secondary | ICD-10-CM

## 2017-06-25 LAB — BASIC METABOLIC PANEL
BUN: 19 mg/dL (ref 6–23)
CO2: 31 mEq/L (ref 19–32)
Calcium: 9.6 mg/dL (ref 8.4–10.5)
Chloride: 101 mEq/L (ref 96–112)
Creatinine, Ser: 1.35 mg/dL (ref 0.40–1.50)
GFR: 60 mL/min — AB (ref >60.00)
Glucose, Bld: 106 mg/dL — ABNORMAL HIGH (ref 70–99)
POTASSIUM: 4.5 meq/L (ref 3.5–5.1)
SODIUM: 140 meq/L (ref 135–145)

## 2017-06-25 LAB — LIPID PANEL
CHOL/HDL RATIO: 4
Cholesterol: 217 mg/dL — ABNORMAL HIGH (ref 0–200)
HDL: 53.5 mg/dL (ref >39.00)
LDL Cholesterol: 131 mg/dL — ABNORMAL HIGH (ref 0–99)
NonHDL: 163.41
TRIGLYCERIDES: 162 mg/dL — AB (ref 0.0–149.0)
VLDL: 32.4 mg/dL (ref 0.0–40.0)

## 2017-06-25 LAB — HEPATIC FUNCTION PANEL
ALBUMIN: 4.5 g/dL (ref 3.5–5.2)
ALT: 19 U/L (ref 0–53)
AST: 18 U/L (ref 0–37)
Alkaline Phosphatase: 66 U/L (ref 39–117)
Bilirubin, Direct: 0.1 mg/dL (ref 0.0–0.3)
Total Bilirubin: 0.9 mg/dL (ref 0.2–1.2)
Total Protein: 7 g/dL (ref 6.0–8.3)

## 2017-06-25 LAB — CBC WITH DIFFERENTIAL/PLATELET
Basophils Absolute: 0 10*3/uL (ref 0.0–0.1)
Basophils Relative: 0.3 % (ref 0.0–3.0)
EOS PCT: 2.4 % (ref 0.0–5.0)
Eosinophils Absolute: 0.1 10*3/uL (ref 0.0–0.7)
HCT: 44.4 % (ref 39.0–52.0)
HEMOGLOBIN: 15 g/dL (ref 13.0–17.0)
Lymphocytes Relative: 17.6 % (ref 12.0–46.0)
Lymphs Abs: 0.9 10*3/uL (ref 0.7–4.0)
MCHC: 33.9 g/dL (ref 30.0–36.0)
MCV: 85.6 fl (ref 78.0–100.0)
MONOS PCT: 7.4 % (ref 3.0–12.0)
Monocytes Absolute: 0.4 10*3/uL (ref 0.1–1.0)
Neutro Abs: 3.6 10*3/uL (ref 1.4–7.7)
Neutrophils Relative %: 72.3 % (ref 43.0–77.0)
Platelets: 242 10*3/uL (ref 150.0–400.0)
RBC: 5.18 Mil/uL (ref 4.22–5.81)
RDW: 13.5 % (ref 11.5–15.5)
WBC: 5 10*3/uL (ref 4.0–10.5)

## 2017-06-25 LAB — SEDIMENTATION RATE: Sed Rate: 4 mm/hr (ref 0–15)

## 2017-06-25 LAB — TSH: TSH: 1.06 u[IU]/mL (ref 0.35–4.50)

## 2017-06-25 NOTE — Patient Instructions (Addendum)
Stop all supplements    Etc  As reason .   If  Ongoing  cough and    And sweats  Let us know and we can   Reevaluate consider chest xray but you exam is good today .   If   Not controlled  consider counseling and poss medication about the anxiety .       Health Maintenance, Male A healthy lifestyle and preventive care is important for your health and wellness. Ask your health care provider about what schedule of regular examinations is right for you. What should I know about weight and diet? Eat a Healthy Diet  Eat plenty of vegetables, fruits, whole grains, low-fat dairy products, and lean protein.  Do not eat a lot of foods high in solid fats, added sugars, or salt.  Maintain a Healthy Weight Regular exercise can help you achieve or maintain a healthy weight. You should:  Do at least 150 minutes of exercise each week. The exercise should increase your heart rate and make you sweat (moderate-intensity exercise).  Do strength-training exercises at least twice a week.  Watch Your Levels of Cholesterol and Blood Lipids  Have your blood tested for lipids and cholesterol every 5 years starting at 47 years of age. If you are at high risk for heart disease, you should start having your blood tested when you are 48 years old. You may need to have your cholesterol levels checked more often if: ? Your lipid or cholesterol levels are high. ? You are older than 48 years of age. ? You are at high risk for heart disease.  What should I know about cancer screening? Many types of cancers can be detected early and may often be prevented. Lung Cancer  You should be screened every year for lung cancer if: ? You are a current smoker who has smoked for at least 30 years. ? You are a former smoker who has quit within the past 15 years.  Talk to your health care provider about your screening options, when you should start screening, and how often you should be screened.  Colorectal  Cancer  Routine colorectal cancer screening usually begins at 48 years of age and should be repeated every 5-10 years until you are 48 years old. You may need to be screened more often if early forms of precancerous polyps or small growths are found. Your health care provider may recommend screening at an earlier age if you have risk factors for colon cancer.  Your health care provider may recommend using home test kits to check for hidden blood in the stool.  A small camera at the end of a tube can be used to examine your colon (sigmoidoscopy or colonoscopy). This checks for the earliest forms of colorectal cancer.  Prostate and Testicular Cancer  Depending on your age and overall health, your health care provider may do certain tests to screen for prostate and testicular cancer.  Talk to your health care provider about any symptoms or concerns you have about testicular or prostate cancer.  Skin Cancer  Check your skin from head to toe regularly.  Tell your health care provider about any new moles or changes in moles, especially if: ? There is a change in a mole's size, shape, or color. ? You have a mole that is larger than a pencil eraser.  Always use sunscreen. Apply sunscreen liberally and repeat throughout the day.  Protect yourself by wearing long sleeves, pants, a wide-brimmed hat, and  sunglasses when outside.  What should I know about heart disease, diabetes, and high blood pressure?  If you are 40-41 years of age, have your blood pressure checked every 3-5 years. If you are 57 years of age or older, have your blood pressure checked every year. You should have your blood pressure measured twice-once when you are at a hospital or clinic, and once when you are not at a hospital or clinic. Record the average of the two measurements. To check your blood pressure when you are not at a hospital or clinic, you can use: ? An automated blood pressure machine at a pharmacy. ? A home blood  pressure monitor.  Talk to your health care provider about your target blood pressure.  If you are between 65-21 years old, ask your health care provider if you should take aspirin to prevent heart disease.  Have regular diabetes screenings by checking your fasting blood sugar level. ? If you are at a normal weight and have a low risk for diabetes, have this test once every three years after the age of 22. ? If you are overweight and have a high risk for diabetes, consider being tested at a younger age or more often.  A one-time screening for abdominal aortic aneurysm (AAA) by ultrasound is recommended for men aged 46-75 years who are current or former smokers. What should I know about preventing infection? Hepatitis B If you have a higher risk for hepatitis B, you should be screened for this virus. Talk with your health care provider to find out if you are at risk for hepatitis B infection. Hepatitis C Blood testing is recommended for:  Everyone born from [birth year] through 1966.  Anyone with known risk factors for hepatitis C.  Sexually Transmitted Diseases (STDs)  You should be screened each year for STDs including gonorrhea and chlamydia if: ? You are sexually active and are younger than 48 years of age. ? You are older than 48 years of age and your health care provider tells you that you are at risk for this type of infection. ? Your sexual activity has changed since you were last screened and you are at an increased risk for chlamydia or gonorrhea. Ask your health care provider if you are at risk.  Talk with your health care provider about whether you are at high risk of being infected with HIV. Your health care provider may recommend a prescription medicine to help prevent HIV infection.  What else can I do?  Schedule regular health, dental, and eye exams.  Stay current with your vaccines (immunizations).  Do not use any tobacco products, such as cigarettes, chewing tobacco, and  e-cigarettes. If you need help quitting, ask your health care provider.  Limit alcohol intake to no more than 2 drinks per day. One drink equals 12 ounces of beer, 5 ounces of wine, or 1 ounces of hard liquor.  Do not use street drugs.  Do not share needles.  Ask your health care provider for help if you need support or information about quitting drugs.  Tell your health care provider if you often feel depressed.  Tell your health care provider if you have ever been abused or do not feel safe at home. This information is not intended to replace advice given to you by your health care provider. Make sure you discuss any questions you have with your health care provider. Document Released: 10/17/2007 Document Revised: 12/18/2015 Document Reviewed: 01/22/2015 Elsevier Interactive Patient Education  2018  [place].      Generalized Anxiety Disorder, Adult Generalized anxiety disorder (GAD) is a mental health disorder. People with this condition constantly worry about everyday events. Unlike normal anxiety, worry related to GAD is not triggered by a specific event. These worries also do not fade or get better with time. GAD interferes with life functions, including relationships, work, and school. GAD can vary from mild to severe. People with severe GAD can have intense waves of anxiety with physical symptoms (panic attacks). What are the causes? The exact cause of GAD is not known. What increases the risk? This condition is more likely to develop in:  Women.  People who have a family history of anxiety disorders.  People who are very shy.  People who experience very stressful life events, such as the death of a loved one.  People who have a very stressful family environment.  What are the signs or symptoms? People with GAD often worry excessively about many things in their lives, such as their health and family. They may also be overly concerned about:  Doing well at  work.  Being on time.  Natural disasters.  Friendships.  Physical symptoms of GAD include:  Fatigue.  Muscle tension or having muscle twitches.  Trembling or feeling shaky.  Being easily startled.  Feeling like your heart is pounding or racing.  Feeling out of breath or like you cannot take a deep breath.  Having trouble falling asleep or staying asleep.  Sweating.  Nausea, diarrhea, or irritable bowel syndrome (IBS).  Headaches.  Trouble concentrating or remembering facts.  Restlessness.  Irritability.  How is this diagnosed? Your health care provider can diagnose GAD based on your symptoms and medical history. You will also have a physical exam. The health care provider will ask specific questions about your symptoms, including how severe they are, when they started, and if they come and go. Your health care provider may ask you about your use of alcohol or drugs, including prescription medicines. Your health care provider may refer you to a mental health specialist for further evaluation. Your health care provider will do a thorough examination and may perform additional tests to rule out other possible causes of your symptoms. To be diagnosed with GAD, a person must have anxiety that:  Is out of his or her control.  Affects several different aspects of his or her life, such as work and relationships.  Causes distress that makes him or her unable to take part in normal activities.  Includes at least three physical symptoms of GAD, such as restlessness, fatigue, trouble concentrating, irritability, muscle tension, or sleep problems.  Before your health care provider can confirm a diagnosis of GAD, these symptoms must be present more days than they are not, and they must last for six months or longer. How is this treated? The following therapies are usually used to treat GAD:  Medicine. Antidepressant medicine is usually prescribed for long-term daily control.  Antianxiety medicines may be added in severe cases, especially when panic attacks occur.  Talk therapy (psychotherapy). Certain types of talk therapy can be helpful in treating GAD by providing support, education, and guidance. Options include: ? Cognitive behavioral therapy (CBT). People learn coping skills and techniques to ease their anxiety. They learn to identify unrealistic or negative thoughts and behaviors and to replace them with positive ones. ? Acceptance and commitment therapy (ACT). This treatment teaches people how to be mindful as a way to cope with unwanted thoughts and  feelings. ? Biofeedback. This process trains you to manage your body's response (physiological response) through breathing techniques and relaxation methods. You will work with a therapist while machines are used to monitor your physical symptoms.  Stress management techniques. These include yoga, meditation, and exercise.  A mental health specialist can help determine which treatment is best for you. Some people see improvement with one type of therapy. However, other people require a combination of therapies. Follow these instructions at home:  Take over-the-counter and prescription medicines only as told by your health care provider.  Try to maintain a normal routine.  Try to anticipate stressful situations and allow extra time to manage them.  Practice any stress management or self-calming techniques as taught by your health care provider.  Do not punish yourself for setbacks or for not making progress.  Try to recognize your accomplishments, even if they are small.  Keep all follow-up visits as told by your health care provider. This is important. Contact a health care provider if:  Your symptoms do not get better.  Your symptoms get worse.  You have signs of depression, such as: ? A persistently sad, cranky, or irritable mood. ? Loss of enjoyment in activities that used to bring you  joy. ? Change in weight or eating. ? Changes in sleeping habits. ? Avoiding friends or family members. ? Loss of energy for normal tasks. ? Feelings of guilt or worthlessness. Get help right away if:  You have serious thoughts about hurting yourself or others. If you ever feel like you may hurt yourself or others, or have thoughts about taking your own life, get help right away. You can go to your nearest emergency department or call:  Your local emergency services (911 in the [place]  A suicide crisis helpline, such as the [facility] [facility] at 225-810-8574. This is open 24 hours a day.  Summary  Generalized anxiety disorder (GAD) is a mental health disorder that involves worry that is not triggered by a specific event.  People with GAD often worry excessively about many things in their lives, such as their health and family.  GAD may cause physical symptoms such as restlessness, trouble concentrating, sleep problems, frequent sweating, nausea, diarrhea, headaches, and trembling or muscle twitching.  A mental health specialist can help determine which treatment is best for you. Some people see improvement with one type of therapy. However, other people require a combination of therapies. This information is not intended to replace advice given to you by your health care provider. Make sure you discuss any questions you have with your health care provider. Document Released: 08/15/2012 Document Revised: 03/10/2016 Document Reviewed: 03/10/2016 Elsevier Interactive Patient Education  [employer]. Below from Up to date   Kratom-Kratom (Mitragyna speciosa), an herb with opioid and stimulant-like properties, contains indole alkaloids, principally mitragynine and 7-HO-mitragynine, with mu-opioid receptor agonism [85]. It has been used for self-treatment of opioid withdrawal, with little published evidence of efficacy and increasing numbers of reports of lethal  overdose and other adverse effects [85]. Kratom should not be used in patients withdrawing from opioids. Investigation of kratom's efficacy and toxicity is limited to case reports/series [85]. In higher doses of 5 to 15 g, frequent and prolonged ingestion of kratom for pain or recreational use has been associated with respiratory depression, anorexia, weight loss, seizures, depression, psychosis, physiologic tolerance, and withdrawal (similar to that seen with opioids) [86]. Opioid withdrawal symptoms emerge 12 to 24 hours after last use and persist  for up to seven days. There are no controlled trials supportive of specific pharmacologic treatment of kratom withdrawal, but a 2019 review suggested treatment similar to withdrawal from other opioids, specifically alpha-2-adrenergic agonists and symptomatic treatment [87]. Kratom use is prohibited in some countries and [place] states. The Food and Drug Administration in the [place] has issued multiple advisories on kratom: on health risks with its use for opioid withdrawal [88], its association with a multi-state Salmonella outbreak in 2018 [89], and on a mandatory recall of kratom-containing products associated with [place] in 2018 [90].

## 2017-06-26 LAB — HIV ANTIBODY (ROUTINE TESTING W REFLEX): HIV 1&2 Ab, 4th Generation: NONREACTIVE

## 2017-08-04 ENCOUNTER — Encounter: Payer: Self-pay | Admitting: Internal Medicine

## 2017-08-04 NOTE — Telephone Encounter (Signed)
Pt last seen 06/25/17 for CPE Insomnia discussed at this OV as possibly coming from anxiety.   Insomnia, unspecified type - poss from anxiety and other  Disc    Expectant management. About anxiety advise look into counseling and we can consider med if appropriate .   Please advise Dr Regis Bill, thanks.

## 2017-08-05 MED ORDER — CITALOPRAM HYDROBROMIDE 20 MG PO TABS: ORAL_TABLET | ORAL | 0 refills | Status: DC

## 2017-08-05 NOTE — Telephone Encounter (Signed)
try  Citalopram 10 mg per day for  1 week and then increase to 20 mg per day  ROV in 3 weeks .  This is  A med for   Control of anxiety so doesn't work right away but as anxiety decreases then  Sleep  Should get  better .   rx citalopram  20 mg  Take 10 mg  Per day for 1 week  Then increase to 20 mg per day  As directed  Disp #)

## 2017-08-05 NOTE — Telephone Encounter (Signed)
Rx sent to [facility] Nothing further needed.

## 2017-08-05 NOTE — Telephone Encounter (Signed)
Pt notified that Rx is being sent to [facility] pharmacy.

## 2017-08-10 NOTE — Telephone Encounter (Signed)
If the medicine makes you feel bad then dont take it .  Many things can contribute . But if you have been doing supplements or  kratom you could have withdrawal or effects of substances  Depending on what was in them .   We can try  Short term remeron   To see if helps ( non dependent producing ) but may want to make office visit  About this   Can send in remeron   Generic 15 mg take 1 po hs for sleep  Disp 14 no refills   And then fu to see if helps .

## 2017-09-01 ENCOUNTER — Other Ambulatory Visit: Payer: Self-pay | Admitting: Internal Medicine

## 2018-04-08 ENCOUNTER — Ambulatory Visit: Payer: Self-pay | Admitting: Internal Medicine

## 2019-01-04 ENCOUNTER — Telehealth: Payer: PRIVATE HEALTH INSURANCE | Admitting: Family

## 2019-01-04 DIAGNOSIS — R0789 Other chest pain: Secondary | ICD-10-CM

## 2019-01-04 NOTE — Progress Notes (Signed)
Based on what you shared with me, I feel your condition warrants further evaluation and I recommend that you be seen for a face to face office visit.  Given that pain is in your sternum and you have numbness, you need to be seen face to face to be evaluated.    NOTE: If you entered your credit card information for this eVisit, you will not be charged. You may see a "hold" on your card for the $35 but that hold will drop off and you will not have a charge processed.  If you are having a true medical emergency please call 911.     For an urgent face to face visit, [facility] has four urgent care centers for your convenience:   . [facility]    [phone]                  Get Driving Directions  [id number] [place], [place] [address] . 10 am to 8 pm Monday-Friday . 12 pm to 8 pm Saturday-Sunday   . [facility] at [place]                  Get Driving Directions  [id number] [place], [place], [place] [address] . 8 am to 8 pm Monday-Friday . 9 am to 6 pm Saturday . 11 am to 6 pm Sunday   . [facility] at [place]                  Get Driving Directions   [address] [place], [place] [address] . 8 am to 8 pm Monday-Friday . 8 am to 4 pm Saturday-Sunday    . [facility] at [place]                    Get Driving Directions  [id number]  [address] [place], [place] [address]  . Monday-Friday, 12 PM to 6 PM    Your e-visit answers were reviewed by a board certified advanced clinical practitioner to complete your personal care plan.  Thank you for using e-Visits.

## 2019-01-13 ENCOUNTER — Ambulatory Visit: Payer: PRIVATE HEALTH INSURANCE | Admitting: Internal Medicine

## 2019-09-06 ENCOUNTER — Other Ambulatory Visit: Payer: Self-pay

## 2019-09-06 ENCOUNTER — Emergency Department (HOSPITAL_COMMUNITY)
Admission: EM | Admit: 2019-09-06 | Discharge: 2019-09-07 | Disposition: A | Discharge status: Home / Self Care | Payer: PRIVATE HEALTH INSURANCE | Attending: Emergency Medicine | Admitting: Emergency Medicine

## 2019-09-06 ENCOUNTER — Emergency Department (HOSPITAL_COMMUNITY): Payer: PRIVATE HEALTH INSURANCE

## 2019-09-06 DIAGNOSIS — F419 Anxiety disorder, unspecified: Secondary | ICD-10-CM | POA: Insufficient documentation

## 2019-09-06 DIAGNOSIS — Z20822 Contact with and (suspected) exposure to covid-19: Secondary | ICD-10-CM | POA: Insufficient documentation

## 2019-09-06 DIAGNOSIS — F22 Delusional disorders: Secondary | ICD-10-CM | POA: Insufficient documentation

## 2019-09-06 DIAGNOSIS — Z79899 Other long term (current) drug therapy: Secondary | ICD-10-CM | POA: Insufficient documentation

## 2019-09-06 DIAGNOSIS — Z87891 Personal history of nicotine dependence: Secondary | ICD-10-CM | POA: Insufficient documentation

## 2019-09-06 DIAGNOSIS — R4689 Other symptoms and signs involving appearance and behavior: Secondary | ICD-10-CM

## 2019-09-06 LAB — CBC WITH DIFFERENTIAL/PLATELET
Abs Immature Granulocytes: 0.05 10*3/uL (ref 0.00–0.07)
Basophils Absolute: 0.1 10*3/uL (ref 0.0–0.1)
Basophils Relative: 0 %
Eosinophils Absolute: 0.3 10*3/uL (ref 0.0–0.5)
Eosinophils Relative: 2 %
HCT: 46 % (ref 39.0–52.0)
Hemoglobin: 15.2 g/dL (ref 13.0–17.0)
Immature Granulocytes: 0 %
Lymphocytes Relative: 8 %
Lymphs Abs: 1 10*3/uL (ref 0.7–4.0)
MCH: 30.2 pg (ref 26.0–34.0)
MCHC: 33 g/dL (ref 30.0–36.0)
MCV: 91.5 fL (ref 80.0–100.0)
Monocytes Absolute: 0.7 10*3/uL (ref 0.1–1.0)
Monocytes Relative: 6 %
Neutro Abs: 10.7 10*3/uL — ABNORMAL HIGH (ref 1.7–7.7)
Neutrophils Relative %: 84 %
Platelets: 350 10*3/uL (ref 150–400)
RBC: 5.03 MIL/uL (ref 4.22–5.81)
RDW: 13.9 % (ref 11.5–15.5)
WBC: 12.8 10*3/uL — ABNORMAL HIGH (ref 4.0–10.5)
nRBC: 0 % (ref 0.0–0.2)

## 2019-09-06 LAB — COMPREHENSIVE METABOLIC PANEL
ALT: 28 U/L (ref 0–44)
AST: 28 U/L (ref 15–41)
Albumin: 4.7 g/dL (ref 3.5–5.0)
Alkaline Phosphatase: 84 U/L (ref 38–126)
Anion gap: 13 (ref 5–15)
BUN: 16 mg/dL (ref 6–20)
CO2: 24 mmol/L (ref 22–32)
Calcium: 8.9 mg/dL (ref 8.9–10.3)
Chloride: 103 mmol/L (ref 98–111)
Creatinine, Ser: 1.69 mg/dL — ABNORMAL HIGH (ref 0.61–1.24)
GFR calc Af Amer: 54 mL/min — ABNORMAL LOW (ref >60)
GFR calc non Af Amer: 46 mL/min — ABNORMAL LOW (ref >60)
Glucose, Bld: 118 mg/dL — ABNORMAL HIGH (ref 70–99)
Potassium: 3.7 mmol/L (ref 3.5–5.1)
Sodium: 140 mmol/L (ref 135–145)
Total Bilirubin: 0.5 mg/dL (ref 0.3–1.2)
Total Protein: 7.4 g/dL (ref 6.5–8.1)

## 2019-09-06 LAB — ETHANOL: Alcohol, Ethyl (B): 251 mg/dL — ABNORMAL HIGH (ref <10)

## 2019-09-06 MED ORDER — LORAZEPAM 1 MG PO TABS: 0.0000 mg | ORAL_TABLET | Freq: Two times a day (BID) | ORAL | Status: DC

## 2019-09-06 MED ORDER — HALOPERIDOL LACTATE 5 MG/ML IJ SOLN: 5.0000 mg | Freq: Once | INTRAMUSCULAR | Status: DC

## 2019-09-06 MED ORDER — THIAMINE HCL 100 MG/ML IJ SOLN: 100.0000 mg | Freq: Every day | INTRAMUSCULAR | Status: DC

## 2019-09-06 MED ORDER — LORAZEPAM 2 MG/ML IJ SOLN: 2.0000 mg | Freq: Once | INTRAMUSCULAR | Status: DC

## 2019-09-06 MED ORDER — LORAZEPAM 2 MG/ML IJ SOLN: 0.0000 mg | Freq: Two times a day (BID) | INTRAMUSCULAR | Status: DC

## 2019-09-06 MED ORDER — ACETAMINOPHEN 325 MG PO TABS
650.0000 mg | ORAL_TABLET | Freq: Once | ORAL | Status: AC
  Administered 2019-09-06: 650 mg via ORAL
  Filled 2019-09-06: qty 2

## 2019-09-06 MED ORDER — LORAZEPAM 1 MG PO TABS
0.0000 mg | ORAL_TABLET | Freq: Four times a day (QID) | ORAL | Status: DC
  Administered 2019-09-06: 2 mg via ORAL
  Filled 2019-09-06: qty 2

## 2019-09-06 MED ORDER — LORAZEPAM 2 MG/ML IJ SOLN: 0.0000 mg | Freq: Four times a day (QID) | INTRAMUSCULAR | Status: DC

## 2019-09-06 MED ORDER — THIAMINE HCL 100 MG PO TABS
100.0000 mg | ORAL_TABLET | Freq: Every day | ORAL | Status: DC
  Administered 2019-09-07: 100 mg via ORAL
  Filled 2019-09-06: qty 1
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
## 2019-09-06 NOTE — ED Notes (Signed)
Patient became verbally abusive and verbally threatening to [employer] staff and security

## 2019-09-06 NOTE — ED Notes (Signed)
Patient refused chest Xray.  
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
## 2019-09-07 LAB — RAPID URINE DRUG SCREEN, HOSP PERFORMED
Amphetamines: NOT DETECTED
Barbiturates: NOT DETECTED
Benzodiazepines: POSITIVE — AB
Cocaine: NOT DETECTED
Opiates: NOT DETECTED
Tetrahydrocannabinol: NOT DETECTED

## 2019-09-07 LAB — RESPIRATORY PANEL BY RT PCR (FLU A&B, COVID)
Influenza A by PCR: NEGATIVE
Influenza B by PCR: NEGATIVE
SARS Coronavirus 2 by RT PCR: NEGATIVE

## 2019-09-07 MED ORDER — NICOTINE 21 MG/24HR TD PT24
21.0000 mg | MEDICATED_PATCH | Freq: Every day | TRANSDERMAL | Status: DC
  Administered 2019-09-07 (×2): 21 mg via TRANSDERMAL
  Filled 2019-09-07 (×2): qty 1

## 2019-09-07 MED ORDER — HYDROXYZINE HCL 50 MG PO TABS: 50.0000 mg | ORAL_TABLET | Freq: Every evening | ORAL | 0 refills | Status: AC | PRN

## 2019-09-07 MED ORDER — HYDROXYZINE PAMOATE 50 MG PO CAPS
50.0000 mg | ORAL_CAPSULE | Freq: Every evening | ORAL | 0 refills | Status: AC | PRN
Start: 2019-09-07 — End: ?

## 2019-09-07 MED ORDER — HYDROXYZINE HCL 25 MG PO TABS: 50.0000 mg | ORAL_TABLET | Freq: Every evening | ORAL | Status: DC | PRN

## 2019-09-07 NOTE — BH Assessment (Signed)
[facility] Assessment Progress Note  Per Hampton Abbot, MD, this pt does not require psychiatric hospitalization at this time.  Pt presents under IVC initiated by law enforcement and upheld by EDP Lennice Sites, MD, which has been rescinded by Dr Dwyane Dee.  Pt is to be discharged from [facility] with recommendation to continue treatment with Chucky May, MD.  This has been included in pt's discharge instructions.  Pt's nurse, Eustaquio Maize, has been notified.  Jalene Mullet, [place] Triage Specialist [phone]

## 2019-09-07 NOTE — ED Notes (Signed)
Patient became verbally aggressive, threatening to leave. Patient was redirected and calmed down

## 2019-09-07 NOTE — ED Notes (Signed)
[phone] Judeen Hammans (wife)

## 2019-09-07 NOTE — ED Notes (Signed)
Pt DCd off unit to home per provider. Pt alert, calm, cooperative, no s/s of distress. DC information given to and reviewed with pt, pt acknowledged understanding. Belongings given to pt. Pt ambulatory off unit, escorted by NT. Pt transported by family.

## 2019-09-07 NOTE — Consult Note (Signed)
[facility] Psych ED Discharge  09/07/2019 10:22 AM Henry Woods  MRN:  [id number] Principal Problem: <principal problem not specified> Discharge Diagnoses: Active Problems:   * No active hospital problems. *   Subjective: "I am fine, I do not remember everything that happened last night." Patient assessed by nurse practitioner along with Dr. Dwyane Dee.  Patient alert and oriented, answers appropriately. Patient reports recently began new medications prescribed by outpatient psychiatrist, Dr. Toy Care.  Patient reports alcohol use on last evening, patient reports rare use of alcohol typically.  Patient denies substance use. Patient denies suicidal and homicidal ideations.  Patient denies history of self-harm, denies history of suicide attempts.  Patient denies auditory and visual hallucinations.  Patient denies symptoms of paranoia. Patient reports he lives at home with his wife, Judeen Hammans.  Patient gives verbal consent to speak with wife Judeen Hammans phone number [phone].  Patient reports currently employed outside of home. Spoke with patient's wife Judeen Hammans: Patient's wife denies concerns regarding patient safety.  Patient's wife reports guns in home are secured and patient does not have access to weapons.  Patient's wife verbalizes concern regarding recent medication change initiating Remeron.  Patient's wife states "I am concerned about the side effects of the Remeron."   Total Time spent with patient: 20 minutes  Past Psychiatric History: Anxiety  Past Medical History:  Past Medical History:  Diagnosis Date  . Arthritis   . Chicken pox   . GERD (gastroesophageal reflux disease)   . Hyperlipidemia     Past Surgical History:  Procedure Laterality Date  . HAND SURGERY Left 1981   Family History:  Family History  Problem Relation Age of Onset  . Arthritis Mother   . Hyperlipidemia Mother   . Hyperlipidemia Father   . Colon cancer Maternal Grandmother   . Colon polyps Mother   . Colon polyps  Maternal Grandmother   . Colon polyps Maternal Uncle   . Prostate cancer Maternal Uncle   . Diabetes Paternal Uncle   . Diabetes Paternal Aunt   . Diabetes Paternal Grandmother   . Prostate cancer Paternal Grandfather    Family Psychiatric  History: Denies Social History:  Social History   Substance and Sexual Activity  Alcohol Use Yes  . Alcohol/week: 0.0 standard drinks   Comment: occ     Social History   Substance and Sexual Activity  Drug Use No    Social History   Socioeconomic History  . Marital status: Married    Spouse name: Not on file  . Number of children: Not on file  . Years of education: Not on file  . Highest education level: Not on file  Occupational History  . Not on file  Tobacco Use  . Smoking status: Former Smoker    Packs/day: 0.50    Types: Cigarettes  . Smokeless tobacco: Never Used  . Tobacco comment: vapor  Substance and Sexual Activity  . Alcohol use: Yes    Alcohol/week: 0.0 standard drinks    Comment: occ  . Drug use: No  . Sexual activity: Not on file  Other Topics Concern  . 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 Difficulty of Paying Living Expenses:   Food Insecurity:   . Worried About Charity fundraiser in the Last Year:   . Arboriculturist in the Last Year:   Transportation Needs:   . Film/video editor (Medical):   Marland Kitchen Lack of Transportation  (Non-Medical):   Physical Activity:   . Days of Exercise per Week:   . Minutes of Exercise per Session:   Stress:   . Feeling of Stress :   Social Connections:   . Frequency of Communication with Friends and Family:   . Frequency of Social Gatherings with Friends and Family:   . Attends Religious Services:   . Active Member of Clubs or Organizations:   . Attends Archivist Meetings:   Marland Kitchen Marital Status:     Has this patient used any form of tobacco in the last 30 days? (Cigarettes, Smokeless Tobacco, Cigars, and/or Pipes) A prescription for an FDA-approved tobacco cessation medication was offered at discharge and the patient refused  Current Medications: Current Facility-Administered Medications  Medication Dose Route Frequency Provider Last Rate Last Admin  . haloperidol lactate (HALDOL) injection 5 mg  5 mg Intramuscular Once Lennice Sites, DO   Stopped at 09/06/19 2259  . LORazepam (ATIVAN) injection 0-4 mg  0-4 mg Intravenous Q6H Curatolo, Adam, DO       Or  . LORazepam (ATIVAN) tablet 0-4 mg  0-4 mg Oral Q6H Curatolo, Adam, DO   2 mg at 09/06/19 2305  . [START ON 09/09/2019] LORazepam (ATIVAN) injection 0-4 mg  0-4 mg Intravenous Q12H Curatolo, Adam, DO       Or  . Derrill Memo ON 09/09/2019] LORazepam (ATIVAN) tablet 0-4 mg  0-4 mg Oral Q12H Curatolo, Adam, DO      . LORazepam (ATIVAN) injection 2 mg  2 mg Intramuscular Once Curatolo, Adam, DO   Stopped at 09/06/19 2259  . nicotine (NICODERM CQ - dosed in mg/24 hours) patch 21 mg  21 mg Transdermal Daily Palumbo, April, MD   21 mg at 09/07/19 0940  . thiamine tablet 100 mg  100 mg Oral Daily Curatolo, Adam, DO   100 mg at 09/07/19 [id number]   Or  . thiamine (B-1) injection 100 mg  100 mg Intravenous Daily Curatolo, Adam, DO       Current Outpatient Medications  Medication Sig Dispense Refill  . ALPRAZolam (XANAX) 1 MG tablet Take 1 mg by mouth 2 (two) times daily as needed for anxiety.    . mirtazapine (REMERON) 30 MG tablet Take  30 mg by mouth at bedtime.    . citalopram (CELEXA) 20 MG tablet TAKE 2 TABLET DAILY AS DIRECTED. (Patient not taking: Reported on 09/06/2019) 60 tablet 1   PTA Medications: (Not in a hospital admission)   Musculoskeletal: Strength & Muscle Tone: within normal limits Gait & Station: normal Patient leans: N/A  Psychiatric Specialty Exam: Physical Exam Vitals and nursing note reviewed.  Constitutional:      Appearance: He is well-developed.  HENT:     Head: Normocephalic.  Cardiovascular:  Rate and Rhythm: Normal rate.  Pulmonary:     Effort: Pulmonary effort is normal.  Neurological:     Mental Status: He is alert and oriented to person, place, and time.  Psychiatric:        Mood and Affect: Mood normal.        Behavior: Behavior normal.        Thought Content: Thought content normal.        Judgment: Judgment normal.     Review of Systems  Constitutional: Negative.   HENT: Negative.   Eyes: Negative.   Respiratory: Negative.   Cardiovascular: Negative.   Gastrointestinal: Negative.   Genitourinary: Negative.   Musculoskeletal: Negative.   Skin: Negative.   Neurological: Negative.   Psychiatric/Behavioral: Negative.     Blood pressure 104/70, pulse 87, temperature 98.5 F (36.9 C), temperature source Oral, resp. rate 18, SpO2 100 %.There is no height or weight on file to calculate BMI.  General Appearance: Casual and Fairly Groomed  Eye Contact:  Good  Speech:  Clear and Coherent and Normal Rate  Volume:  Normal  Mood:  Euthymic  Affect:  Appropriate and Congruent  Thought Process:  Coherent, Goal Directed and Descriptions of Associations: Intact  Orientation:  Full (Time, Place, and Person)  Thought Content:  WDL and Logical  Suicidal Thoughts:  No  Homicidal Thoughts:  No  Memory:  Immediate;   Good Recent;   Good Remote;   Good  Judgement:  Good  Insight:  Fair  Psychomotor Activity:  Normal  Concentration:  Concentration: Good and Attention Span:  Good  Recall:  Good  Fund of Knowledge:  Good  Language:  Good  Akathisia:  No  Handed:  Right  AIMS (if indicated):     Assets:  Communication Skills Desire for Improvement Financial Resources/Insurance Housing Intimacy Leisure Time Physical Health Resilience Social Support Talents/Skills Vocational/Educational  ADL's:  Intact  Cognition:  WNL  Sleep:        Demographic Factors:  Male and Caucasian  Loss Factors: NA  Historical Factors: NA  Risk Reduction Factors:   Employed, Living with another person, especially a relative, Positive social support, Positive therapeutic relationship and Positive coping skills or problem solving skills  Continued Clinical Symptoms:    Cognitive Features That Contribute To Risk:  None    Suicide Risk:  Minimal: No identifiable suicidal ideation.  Patients presenting with no risk factors but with morbid ruminations; may be classified as minimal risk based on the severity of the depressive symptoms    Plan Of Care/Follow-up recommendations:  Patient discussed Dr. Dwyane Dee. Recommend stop Remeron, follow-up with outpatient psychiatrist.  Recommend Vistaril 50 mg nightly as needed related to difficulty falling asleep. Other:  Follow-up with established outpatient psychiatrist, Dr. Toy Care  Disposition: Discharge Henry Woods, [place] 09/07/2019, 10:22 AM

## 2019-09-07 NOTE — Discharge Instructions (Signed)
For your behavioral health needs, you are advised to continue treatment with Chucky May, MD at your earliest opportunity:       Chucky May, MD      [address]      [place], [place] [address]      [phone]

## 2019-09-07 NOTE — Patient Outreach (Signed)
[facility] met with the patient in order to provide substance use recovery support and provide information for substance use recovery resources. Patient reports a history of alcohol use. [facility] talked to the patient about outpatient substance use treatment as well as AA meetings as options for substance use recovery support due to the patient currently working. [facility]PSS provided the patient with information for substance use treatment resources including residential/outpatient substance use treatment center list, online/in-person AA meeting list, detox treatment center list, and [facility] contact information. [facility] strongly encouraged the patient to follow up with [facility] if needed for further help with getting connected to substance use treatment resources.

## 2019-09-07 NOTE — ED Notes (Signed)
Patient calm, CIWA currently 0. Ativan not need at this time

## 2019-09-07 NOTE — ED Notes (Signed)
Patient calm and cooperative after speaking to his wife

## 2019-09-07 NOTE — ED Notes (Signed)
Patient resting quietly.

## 2019-10-20 ENCOUNTER — Other Ambulatory Visit: Payer: Self-pay

## 2019-12-29 NOTE — Telephone Encounter (Signed)
Yes  You should have a colonoscopy megan plesase do a referral for colonscopy screening . Thanks

## 2020-01-01 ENCOUNTER — Other Ambulatory Visit: Payer: Self-pay

## 2020-01-01 DIAGNOSIS — Z1211 Encounter for screening for malignant neoplasm of colon: Secondary | ICD-10-CM
